# Patient Record
Sex: Female | Born: 1965 | Race: White | Hispanic: No | Smoking: Never smoker
Health system: Southern US, Community
[De-identification: ages and names within clinical notes are randomized; demographics above are authoritative.]

## PROBLEM LIST (undated history)

## (undated) DIAGNOSIS — C801 Malignant (primary) neoplasm, unspecified: Secondary | ICD-10-CM

## (undated) DIAGNOSIS — I10 Essential (primary) hypertension: Secondary | ICD-10-CM

## (undated) DIAGNOSIS — R011 Cardiac murmur, unspecified: Secondary | ICD-10-CM

## (undated) DIAGNOSIS — Z889 Allergy status to unspecified drugs, medicaments and biological substances status: Secondary | ICD-10-CM

## (undated) DIAGNOSIS — F419 Anxiety disorder, unspecified: Secondary | ICD-10-CM

## (undated) DIAGNOSIS — N2889 Other specified disorders of kidney and ureter: Secondary | ICD-10-CM

## (undated) HISTORY — PX: BREAST SURGERY: SHX581

---

## 1987-04-20 HISTORY — PX: TUBAL LIGATION: SHX77

## 2000-10-17 ENCOUNTER — Other Ambulatory Visit: Admission: RE | Admit: 2000-10-17 | Discharge: 2000-10-17 | Payer: Self-pay | Admitting: Obstetrics and Gynecology

## 2000-11-10 ENCOUNTER — Encounter: Payer: Self-pay | Admitting: Obstetrics and Gynecology

## 2000-11-10 ENCOUNTER — Ambulatory Visit (HOSPITAL_COMMUNITY): Admission: RE | Admit: 2000-11-10 | Discharge: 2000-11-10 | Payer: Self-pay | Admitting: Obstetrics and Gynecology

## 2003-04-20 HISTORY — PX: BREAST ENHANCEMENT SURGERY: SHX7

## 2003-08-06 ENCOUNTER — Other Ambulatory Visit: Admission: RE | Admit: 2003-08-06 | Discharge: 2003-08-06 | Payer: Self-pay | Admitting: Obstetrics and Gynecology

## 2004-04-19 HISTORY — PX: OTHER SURGICAL HISTORY: SHX169

## 2011-04-20 DIAGNOSIS — N2889 Other specified disorders of kidney and ureter: Secondary | ICD-10-CM

## 2011-04-20 HISTORY — DX: Other specified disorders of kidney and ureter: N28.89

## 2011-10-06 ENCOUNTER — Encounter (HOSPITAL_COMMUNITY): Payer: Self-pay

## 2011-10-06 ENCOUNTER — Emergency Department (HOSPITAL_COMMUNITY)
Admission: EM | Admit: 2011-10-06 | Discharge: 2011-10-06 | Disposition: A | Discharge status: Home / Self Care | Payer: BC Managed Care – PPO | Attending: Emergency Medicine | Admitting: Emergency Medicine

## 2011-10-06 ENCOUNTER — Emergency Department (HOSPITAL_COMMUNITY): Payer: BC Managed Care – PPO

## 2011-10-06 DIAGNOSIS — C649 Malignant neoplasm of unspecified kidney, except renal pelvis: Secondary | ICD-10-CM

## 2011-10-06 DIAGNOSIS — K769 Liver disease, unspecified: Secondary | ICD-10-CM | POA: Insufficient documentation

## 2011-10-06 DIAGNOSIS — N2889 Other specified disorders of kidney and ureter: Secondary | ICD-10-CM

## 2011-10-06 DIAGNOSIS — I10 Essential (primary) hypertension: Secondary | ICD-10-CM

## 2011-10-06 DIAGNOSIS — E782 Mixed hyperlipidemia: Secondary | ICD-10-CM

## 2011-10-06 DIAGNOSIS — R109 Unspecified abdominal pain: Secondary | ICD-10-CM | POA: Insufficient documentation

## 2011-10-06 DIAGNOSIS — R160 Hepatomegaly, not elsewhere classified: Secondary | ICD-10-CM

## 2011-10-06 DIAGNOSIS — N289 Disorder of kidney and ureter, unspecified: Secondary | ICD-10-CM | POA: Insufficient documentation

## 2011-10-06 DIAGNOSIS — N390 Urinary tract infection, site not specified: Secondary | ICD-10-CM | POA: Insufficient documentation

## 2011-10-06 HISTORY — DX: Essential (primary) hypertension: I10

## 2011-10-06 LAB — DIFFERENTIAL
Eosinophils Absolute: 0.1 10*3/uL (ref 0.0–0.7)
Eosinophils Relative: 1 % (ref 0–5)
Lymphocytes Relative: 13 % (ref 12–46)
Lymphs Abs: 2.3 10*3/uL (ref 0.7–4.0)
Monocytes Absolute: 1.5 10*3/uL — ABNORMAL HIGH (ref 0.1–1.0)
Monocytes Relative: 8 % (ref 3–12)

## 2011-10-06 LAB — COMPREHENSIVE METABOLIC PANEL
AST: 14 U/L (ref 0–37)
BUN: 11 mg/dL (ref 6–23)
CO2: 26 mEq/L (ref 19–32)
Calcium: 9.2 mg/dL (ref 8.4–10.5)
Chloride: 99 mEq/L (ref 96–112)
Creatinine, Ser: 0.71 mg/dL (ref 0.50–1.10)
GFR calc Af Amer: >90 mL/min (ref >90)
GFR calc non Af Amer: >90 mL/min (ref >90)
Glucose, Bld: 85 mg/dL (ref 70–99)
Total Bilirubin: 0.8 mg/dL (ref 0.3–1.2)

## 2011-10-06 LAB — URINE MICROSCOPIC-ADD ON

## 2011-10-06 LAB — CBC
HCT: 41.4 % (ref 36.0–46.0)
Hemoglobin: 14 g/dL (ref 12.0–15.0)
MCH: 27.8 pg (ref 26.0–34.0)
MCV: 82.1 fL (ref 78.0–100.0)
Platelets: 286 10*3/uL (ref 150–400)
RBC: 5.04 MIL/uL (ref 3.87–5.11)
WBC: 17.9 10*3/uL — ABNORMAL HIGH (ref 4.0–10.5)

## 2011-10-06 LAB — LIPASE, BLOOD: Lipase: 40 U/L (ref 11–59)

## 2011-10-06 LAB — URINALYSIS, ROUTINE W REFLEX MICROSCOPIC
Hgb urine dipstick: NEGATIVE
Nitrite: NEGATIVE
Protein, ur: NEGATIVE mg/dL
Specific Gravity, Urine: 1.013 (ref 1.005–1.030)
Urobilinogen, UA: 1 mg/dL (ref 0.0–1.0)

## 2011-10-06 MED ORDER — ONDANSETRON HCL 4 MG PO TABS: 4.0000 mg | ORAL_TABLET | Freq: Four times a day (QID) | ORAL | Status: AC

## 2011-10-06 MED ORDER — OXYCODONE-ACETAMINOPHEN 5-325 MG PO TABS: 1.0000 | ORAL_TABLET | Freq: Four times a day (QID) | ORAL | Status: AC | PRN

## 2011-10-06 MED ORDER — IOHEXOL 300 MG/ML  SOLN
100.0000 mL | Freq: Once | INTRAMUSCULAR | Status: AC | PRN
  Administered 2011-10-06: 80 mL via INTRAVENOUS

## 2011-10-06 MED ORDER — CIPROFLOXACIN HCL 500 MG PO TABS: 500.0000 mg | ORAL_TABLET | Freq: Two times a day (BID) | ORAL | Status: AC

## 2011-10-06 MED ORDER — MORPHINE SULFATE 4 MG/ML IJ SOLN
4.0000 mg | Freq: Once | INTRAMUSCULAR | Status: AC
  Administered 2011-10-06: 4 mg via INTRAVENOUS
  Filled 2011-10-06: qty 1

## 2011-10-06 MED ORDER — ONDANSETRON HCL 4 MG/2ML IJ SOLN
4.0000 mg | Freq: Once | INTRAMUSCULAR | Status: AC
  Administered 2011-10-06: 4 mg via INTRAVENOUS
  Filled 2011-10-06: qty 2

## 2011-10-06 MED ORDER — DEXTROSE 5 % IV SOLN
1.0000 g | Freq: Once | INTRAVENOUS | Status: AC
  Administered 2011-10-06: 1 g via INTRAVENOUS
  Filled 2011-10-06: qty 10

## 2011-10-06 MED ORDER — SODIUM CHLORIDE 0.9 % IV BOLUS (SEPSIS)
1000.0000 mL | Freq: Once | INTRAVENOUS | Status: AC
  Administered 2011-10-06: 1000 mL via INTRAVENOUS

## 2011-10-06 NOTE — Discharge Instructions (Signed)

## 2011-10-06 NOTE — ED Notes (Signed)
Pt reports eating lunch without difficulty. States she saw her MD today and MD suggested coming to ER for CT scan.

## 2011-10-06 NOTE — ED Notes (Signed)
Pt finished drinking oral contrast for CT scan. Denies n/v or pain at this time. Awaiting CT. Resting comfortably. Warm blanket provided.

## 2011-10-06 NOTE — ED Provider Notes (Signed)
History     CSN: [id number]  Arrival date & time 10/06/11  1453   First MD Initiated Contact with Patient 10/06/11 1823      Chief Complaint  Patient presents with  . Abdominal Pain    (Consider location/radiation/quality/duration/timing/severity/associated sxs/prior treatment) Patient is a 46 y.o. female presenting with abdominal pain. The history is provided by the patient. No language interpreter was used.  Abdominal Pain The primary symptoms of the illness include abdominal pain and nausea. The primary symptoms of the illness do not include fever, fatigue, shortness of breath, vomiting, diarrhea, hematemesis, hematochezia, dysuria, vaginal discharge or vaginal bleeding. The current episode started 13 to 24 hours ago. The onset of the illness was gradual. The problem has been gradually worsening.  The abdominal pain began 13 to24 hours ago. The pain came on gradually. The abdominal pain has been gradually worsening since its onset. The abdominal pain is located in the RLQ. The abdominal pain does not radiate. The abdominal pain is relieved by nothing. The abdominal pain is exacerbated by movement and certain positions.  Nausea began today. The nausea is exacerbated by food.  The patient states that she believes she is currently not pregnant. The patient has not had a change in bowel habit. Symptoms associated with the illness do not include chills, constipation, urgency, frequency or back pain.    Past Medical History  Diagnosis Date  . Hypertension     Past Surgical History  Procedure Date  . Cesarean section     x2  . Tummy tuck   . Breast enhancement surgery     History reviewed. No pertinent family history.  History  Substance Use Topics  . Smoking status: Never Smoker   . Smokeless tobacco: Never Used  . Alcohol Use: Yes     ocasionally    OB History    Grav Para Term Preterm Abortions TAB SAB Ect Mult Living                  Review of Systems    Constitutional: Negative for fever, chills, activity change, appetite change and fatigue.  HENT: Negative for congestion, sore throat, rhinorrhea, neck pain and neck stiffness.   Respiratory: Negative for cough and shortness of breath.   Cardiovascular: Negative for chest pain and palpitations.  Gastrointestinal: Positive for nausea and abdominal pain. Negative for vomiting, diarrhea, constipation, blood in stool, hematochezia and hematemesis.  Genitourinary: Negative for dysuria, urgency, frequency, flank pain, vaginal bleeding and vaginal discharge.  Musculoskeletal: Negative for myalgias, back pain and arthralgias.  Neurological: Negative for dizziness, weakness, light-headedness, numbness and headaches.  All other systems reviewed and are negative.    Allergies  Penicillins  Home Medications   Current Outpatient Rx  Name Route Sig Dispense Refill  . CETIRIZINE HCL 10 MG PO TABS Oral Take 10 mg by mouth daily.    Marland Kitchen CITALOPRAM HYDROBROMIDE 10 MG PO TABS Oral Take 10 mg by mouth at bedtime.    Marland Kitchen FLUTICASONE PROPIONATE 50 MCG/ACT NA SUSP Nasal Place 2 sprays into the nose daily.    Marland Kitchen LOSARTAN POTASSIUM-HCTZ 50-12.5 MG PO TABS Oral Take 1 tablet by mouth daily.    Marland Kitchen ONDANSETRON HCL 4 MG PO TABS Oral Take 1 tablet (4 mg total) by mouth every 6 (six) hours. 12 tablet 0  . OXYCODONE-ACETAMINOPHEN 5-325 MG PO TABS Oral Take 1-2 tablets by mouth every 6 (six) hours as needed for pain. 20 tablet 0    BP 103/71  Pulse  58  Temp 99.1 F (37.3 C) (Oral)  Resp 17  SpO2 99%  LMP 09/17/2011  Physical Exam  Nursing note and vitals reviewed. Constitutional: She is oriented to person, place, and time. She appears well-developed and well-nourished. No distress.  HENT:  Head: Normocephalic and atraumatic.  Mouth/Throat: Oropharynx is clear and moist. No oropharyngeal exudate.  Eyes: Conjunctivae and EOM are normal. Pupils are equal, round, and reactive to light.  Neck: Normal range of  motion. Neck supple.  Cardiovascular: Normal rate, regular rhythm, normal heart sounds and intact distal pulses.  Exam reveals no gallop and no friction rub.   No murmur heard. Pulmonary/Chest: Effort normal and breath sounds normal. No respiratory distress. She exhibits no tenderness.  Abdominal: Soft. Bowel sounds are normal. There is tenderness (RLQ abdominal pain at mcburney point.) in the right lower quadrant. There is guarding and tenderness at McBurney's point. There is no rebound and negative Murphy's sign.       +psoas sign  Musculoskeletal: Normal range of motion. She exhibits no edema and no tenderness.  Neurological: She is alert and oriented to person, place, and time. No cranial nerve deficit.  Skin: Skin is warm and dry. No rash noted.    ED Course  Procedures (including critical care time)  Labs Reviewed  CBC - Abnormal; Notable for the following:    WBC 17.9 (*)     All other components within normal limits  DIFFERENTIAL - Abnormal; Notable for the following:    Neutrophils Relative 78 (*)     Neutro Abs 14.1 (*)     Monocytes Absolute 1.5 (*)     All other components within normal limits  URINALYSIS, ROUTINE W REFLEX MICROSCOPIC - Abnormal; Notable for the following:    Leukocytes, UA SMALL (*)     All other components within normal limits  URINE MICROSCOPIC-ADD ON - Abnormal; Notable for the following:    Squamous Epithelial / LPF FEW (*)     Bacteria, UA FEW (*)     All other components within normal limits  LIPASE, BLOOD  COMPREHENSIVE METABOLIC PANEL   Ct Abdomen Pelvis W Contrast  10/06/2011  *RADIOLOGY REPORT*  Clinical Data: Right abdominal pain.  CT ABDOMEN AND PELVIS WITH CONTRAST  Technique:  Multidetector CT imaging of the abdomen and pelvis was performed following the standard protocol during bolus administration of intravenous contrast.  Contrast: 80mL OMNIPAQUE IOHEXOL 300 MG/ML  SOLN  Comparison: None.  Findings: Heterogeneous mixed medium to high  density and low density mass in the midportion of the right kidney, medially, impinging upon and possibly arising from the renal pelvis.  This measures 4.2 x 3.9 cm on delayed postcontrast image number 14 and 3.4 cm in length on coronal image number 46.  Mild prominence of both renal collecting systems, most pronounced in the upper pole on the right.  No renal, ureteral or bladder calculi.  No additional urinary tract masses are seen.  3.4 cm simple appearing left ovarian cyst.  1.8 cm oval area of low density in the left lobe of the liver, superiorly.  This measures 58 HU in density on image number 10.  There is a 6 mm similar appearing area in the spleen on image number 12  Small sliding hiatal hernia.  Bilateral breast implants. Unremarkable pancreas, gallbladder, adrenal glands, left kidney, urinary bladder and right ovary.  The fundus of the uterus is mildly prominent and mildly inhomogeneous.  No gastrointestinal abnormalities or enlarged lymph nodes. Clear lung bases.  Mild lumbar and lower thoracic spine degenerative changes.  IMPRESSION:  1.  4.2 x 3.9 x 3.4 cm right renal mass, as described above. Differential considerations include renal cell carcinoma, transitional cell carcinoma and oncocytoma. 2.  Nonspecific low density masses in the liver and spleen, as described above. These could represent metastases, complex cysts or benign solid masses.  If clinically indicated, these could be better characterized with pre and postcontrast magnetic resonance imaging. 3.  3.4 cm simple appearing left ovarian cyst. 4.  Probable poorly defined fundal uterine fibroid. 5.  Small sliding hiatal hernia.  Original Report Authenticated By: Darrol Angel, M.D.     1. Renal mass, right   2. Liver mass   3. UTI (lower urinary tract infection)       MDM  New renal mass as well as liver masses. Concern that this is a metastatic type process. I discussed the results with the patient. There is no evidence of appy.  She does have a urinary tract infection 4 she is placed on antibiotics. She'll be discharged home with prompt followup with urology. Discussed with both urology and hospitalist service. Should she could be adequately managed at home.        Dayton Bailiff, MD 10/06/11 2250

## 2011-10-06 NOTE — Consult Note (Signed)
History and Physical  FRANCIA VERRY GUY:[id number] DOB: 1965/12/23 DOA: 10/06/2011  Referring physician: PCP: Tomma Lightning, MD   Chief Complaint: Abdominal pain  HPI:  46 yr old CF awoke last pm 01:00 and noted that she had no pain or other issues, but noted pain subsequen tto this.  She tried to ge tin bed and started feeling febrile and sweaty, and felt nauseous and passe dout for about 1/2 an hour and awoke on the hallway in excruciating cramping pain and the pain was localized in the RUQ and she finally went back into bed and awoke this am with some pain and went to work.  SHe got in to hse ehe rPCP Dr. Isabella Stalling and and denotes significant discomfort in the abdomen, but  Was concious of the pain it was maybe 6/10-took Aleve for this.  The pain goes from up the pelvic bone and to the R sid eof her abdomen, no radiaiton to the back NO issue with the BM., no pain passing urine Went to see Doctor may 30th and had her meds doubled for this thought it was her bp meds last pm that might have caused the pain like symptoms  Chart Review:  Never admitted, has had some cosmetic surgeries in the past  Review of Systems:  No dark stool, no vomit, +nauseous, subjective chills, no temp, is perimenopausal and had a period in January/last one May 31st-was a normal cycle at that time.  No h/o abnormal pap smears--last one in June.  No blood in urine, no dysuria, no h/o kidney stones   Past Medical History  Diagnosis Date  . Hypertension     Past Surgical History  Procedure Date  . Cesarean section     x2  . Tummy tuck   . Breast enhancement surgery     Social History:  reports that she has never smoked. She has never used smokeless tobacco. She reports that she drinks alcohol. She reports that she does not use illicit drugs.  Allergies  Allergen Reactions  . Penicillins Hives    History reviewed. No pertinent family history.   Prior to Admission medications   Medication Sig Start Date  End Date Taking? Authorizing Provider  cetirizine (ZYRTEC) 10 MG tablet Take 10 mg by mouth daily.   Yes Historical Provider, MD  citalopram (CELEXA) 10 MG tablet Take 10 mg by mouth at bedtime.   Yes Historical Provider, MD  fluticasone (FLONASE) 50 MCG/ACT nasal spray Place 2 sprays into the nose daily.   Yes Historical Provider, MD  losartan-hydrochlorothiazide (HYZAAR) 50-12.5 MG per tablet Take 1 tablet by mouth daily.   Yes Historical Provider, MD   Physical Exam: Filed Vitals:   10/06/11 1521 10/06/11 1904 10/06/11 1954  BP: 106/45 114/66 103/71  Pulse: 80 72 58  Temp: 99.1 F (37.3 C)    TempSrc: Oral    Resp: 16 16 17   SpO2: 99% 100% 99%     General:  Alert pleasant CF in NAD  Eyes: no pallor/ict  ENT: poor dentition, throat clear  Neck: soft, supple, no lymphadenopathy  Cardiovascular: s1 s2 no m/r/g  Respiratory: clinically clear  Abdomen: soft, diffuse mass in RUQ, below liver,  Painful exam of Kidney in the r flank, but cannot fully assess  Skin: tan, Pfannenstein scar in Lower quadrant  Musculoskeletal: good rom  Psychiatric: euthymmic  Neurologic: CN2-12   Labs on Admission:  Basic Metabolic Panel:  Lab 10/06/11 5638  NA 136  K 3.5  CL  99  CO2 26  GLUCOSE 85  BUN 11  CREATININE 0.71  CALCIUM 9.2  MG --  PHOS --    Liver Function Tests:  Lab 10/06/11 1833  AST 14  ALT 13  ALKPHOS 61  BILITOT 0.8  PROT 7.9  ALBUMIN 4.0    Lab 10/06/11 1833  LIPASE 40  AMYLASE --   No results found for this basename: AMMONIA:5 in the last 168 hours  CBC:  Lab 10/06/11 1833  WBC 17.9*  NEUTROABS 14.1*  HGB 14.0  HCT 41.4  MCV 82.1  PLT 286    Cardiac Enzymes: No results found for this basename: CKTOTAL:5,CKMB:5,CKMBINDEX:5,TROPONINI:5 in the last 168 hours  Troponin (Point of Care Test) No results found for this basename: TROPIPOC in the last 72 hours  BNP (last 3 results) No results found for this basename: PROBNP:3 in the last  8760 hours  CBG: No results found for this basename: GLUCAP:5 in the last 168 hours   Radiological Exams on Admission: Ct Abdomen Pelvis W Contrast  10/06/2011  *RADIOLOGY REPORT*  Clinical Data: Right abdominal pain.  CT ABDOMEN AND PELVIS WITH CONTRAST  Technique:  Multidetector CT imaging of the abdomen and pelvis was performed following the standard protocol during bolus administration of intravenous contrast.  Contrast: 80mL OMNIPAQUE IOHEXOL 300 MG/ML  SOLN  Comparison: None.  Findings: Heterogeneous mixed medium to high density and low density mass in the midportion of the right kidney, medially, impinging upon and possibly arising from the renal pelvis.  This measures 4.2 x 3.9 cm on delayed postcontrast image number 14 and 3.4 cm in length on coronal image number 46.  Mild prominence of both renal collecting systems, most pronounced in the upper pole on the right.  No renal, ureteral or bladder calculi.  No additional urinary tract masses are seen.  3.4 cm simple appearing left ovarian cyst.  1.8 cm oval area of low density in the left lobe of the liver, superiorly.  This measures 58 HU in density on image number 10.  There is a 6 mm similar appearing area in the spleen on image number 12  Small sliding hiatal hernia.  Bilateral breast implants. Unremarkable pancreas, gallbladder, adrenal glands, left kidney, urinary bladder and right ovary.  The fundus of the uterus is mildly prominent and mildly inhomogeneous.  No gastrointestinal abnormalities or enlarged lymph nodes. Clear lung bases.  Mild lumbar and lower thoracic spine degenerative changes.  IMPRESSION:  1.  4.2 x 3.9 x 3.4 cm right renal mass, as described above. Differential considerations include renal cell carcinoma, transitional cell carcinoma and oncocytoma. 2.  Nonspecific low density masses in the liver and spleen, as described above. These could represent metastases, complex cysts or benign solid masses.  If clinically indicated,  these could be better characterized with pre and postcontrast magnetic resonance imaging. 3.  3.4 cm simple appearing left ovarian cyst. 4.  Probable poorly defined fundal uterine fibroid. 5.  Small sliding hiatal hernia.  Original Report Authenticated By: Darrol Angel, M.D.    EKG: Independently reviewed. none   Active Problems:  * No active hospital problems. *     Assessment/Plan 1. Potential renal cell carcinoma versus transitional or urothelial cell carcinoma-I did speak to Dr. Laverle Patter who agrees that she does need close followup in the outpatient setting but states that if her pain is moderately well controlled and can be controlled she could likely be discharged with close followup with [facility] in the outpatient setting.  I did also curbside oncologist Dr.Livesay given her findings and she is agreeable to this plan of care from an oncological standpoint. I discussed this plan of care with ED physician in charge of patient Dr. Brooke Dare who does agree to coordinate her discharge potentially on by mouth Levaquin/ciprofloxacin and agrees to send urine cultures-she does have a potential urinary tract infection although no high-grade fevers and this can be safely treated with 3-5 day course of by mouth antibiotics until seen by urologist--thank you for this consult  Code Status: Full  Family Communication: Discussed with Pt/Family @ bedisde Disposition Plan: Home with close Urology f/u  Pleas Koch, MD  [facility] Pager [phone]  If 8PM-8AM, please contact floor/night-coverage at [email or web address], password [facility] 10/06/2011, 9:52 PM

## 2011-10-06 NOTE — ED Notes (Signed)
Patient returned from CT

## 2011-10-06 NOTE — ED Notes (Signed)
Patient reports that she had excrutiating right abdominal pain that began at 0100. Patient went to Dr. Gayleen Orem this AM and was told to come to the ED for CT scan. Patient states her pain is only 6/10 currently. Patient denies vomiting, fever, blood in urine, diarrhea.

## 2011-10-06 NOTE — ED Notes (Signed)
Pt states she is not sure if she passed out last night from pain or not. Currently denies dizziness and CP. Pt states she was also nauseated last night but did not vomit. Denies N/V currently.

## 2011-10-06 NOTE — ED Notes (Signed)
Patient transported to CT 

## 2011-10-11 ENCOUNTER — Other Ambulatory Visit: Payer: Self-pay | Admitting: Urology

## 2011-10-12 ENCOUNTER — Encounter (HOSPITAL_BASED_OUTPATIENT_CLINIC_OR_DEPARTMENT_OTHER): Payer: Self-pay | Admitting: *Deleted

## 2011-10-12 ENCOUNTER — Other Ambulatory Visit (HOSPITAL_COMMUNITY): Payer: Self-pay | Admitting: Urology

## 2011-10-12 ENCOUNTER — Ambulatory Visit (HOSPITAL_COMMUNITY)
Admission: RE | Admit: 2011-10-12 | Discharge: 2011-10-12 | Disposition: A | Discharge status: Home / Self Care | Payer: BC Managed Care – PPO | Source: Ambulatory Visit | Attending: Urology | Admitting: Urology

## 2011-10-12 DIAGNOSIS — R160 Hepatomegaly, not elsewhere classified: Secondary | ICD-10-CM

## 2011-10-12 DIAGNOSIS — D739 Disease of spleen, unspecified: Secondary | ICD-10-CM | POA: Insufficient documentation

## 2011-10-12 DIAGNOSIS — D1803 Hemangioma of intra-abdominal structures: Secondary | ICD-10-CM | POA: Insufficient documentation

## 2011-10-12 DIAGNOSIS — C649 Malignant neoplasm of unspecified kidney, except renal pelvis: Secondary | ICD-10-CM

## 2011-10-12 DIAGNOSIS — N289 Disorder of kidney and ureter, unspecified: Secondary | ICD-10-CM | POA: Insufficient documentation

## 2011-10-12 MED ORDER — GADOBENATE DIMEGLUMINE 529 MG/ML IV SOLN
12.0000 mL | Freq: Once | INTRAVENOUS | Status: AC | PRN
  Administered 2011-10-12: 12 mL via INTRAVENOUS

## 2011-10-12 NOTE — Progress Notes (Signed)
To [facility] AT 0615,,Ekg,Istat on arrival-Cxr in epic-Npo after Mn.

## 2011-10-14 DIAGNOSIS — N2889 Other specified disorders of kidney and ureter: Secondary | ICD-10-CM

## 2011-10-14 NOTE — H&P (Signed)
Reason For Visit  Natalie Olsen is a 46 year old female with a right renal mass.   History of Present Illness     Right renal mass: A CT scan done on 10/06/11 reveals a 4.2 x 3.9 mass involving the midportion of the right kidney. It is located very near the renal pelvis and appears to be partially cystic with what appears to be enhancement. It appears to arise from the anterior portion of the kidney just above the midline with encroachment on the collecting system. Her urinalysis was noted to be clear.  The patient reports having experienced acute onset right lower quadrant pain but no flank pain. The patient experienced was associated with nausea and diaphoresis but no vomiting or diarrhea. She currently is not having any pain in the flank. She's not seen any gross hematuria. The pain at the time was severe and was not modified by positional change.    Past Medical History Problems  1. History of  Allergies V15.09 2. History of  Hypertension 401.9  Surgical History Problems  1. History of  Abdominoplasty 2. History of  Breast Surgery Enlargement Procedure 3. History of  Cesarean Section  Current Meds 1. CeleXA 10 MG Oral Tablet; Therapy: (Recorded:21Jun2013) to 2. Fluticasone Propionate 50 MCG/ACT Nasal Suspension; Therapy: (Recorded:21Jun2013) to 3. Losartan Potassium-HCTZ 50-12.5 MG Oral Tablet; Therapy: (Recorded:21Jun2013) to 4. ZyrTEC Allergy 10 MG Oral Tablet; Therapy: (Recorded:21Jun2013) to  Allergies Medication  1. Penicillins  Family History Problems  1. Family history of  Acute Myocardial Infarction V17.3 2. Family history of  Diabetes Mellitus V18.0 3. Family history of  Gout V18.19 4. Family history of  Hypertension V17.49  Social History Problems    Alcohol Use   Caffeine Use   Marital History - Single   Never A Smoker  Review of Systems Genitourinary, constitutional, skin, eye, otolaryngeal, hematologic/lymphatic, cardiovascular, pulmonary, endocrine,  musculoskeletal, gastrointestinal, neurological and psychiatric system(s) were reviewed and pertinent findings if present are noted.    Vitals Vital Signs [ BMI Calculated: 22.08 BSA Calculated: 1.54 Height: 5 ft 2 in Weight: 120 lb  Blood Pressure: 154 / 95 Heart Rate: 63  Physical Exam Constitutional: Well nourished and well developed . No acute distress.  ENT:. The ears and nose are normal in appearance.  Neck: The appearance of the neck is normal and no neck mass is present.  Pulmonary: No respiratory distress and normal respiratory rhythm and effort.  Cardiovascular: Heart rate and rhythm are normal . No peripheral edema.  Abdomen: Incision site(s) well healed (She has a lower transverse abdominal incision secondary to her previous abdominoplasty). The abdomen is soft and nontender. No masses are palpated. No CVA tenderness. No hernias are palpable. No hepatosplenomegaly noted.  Lymphatics: The femoral and inguinal nodes are not enlarged or tender.  Skin: Normal skin turgor, no visible rash and no visible skin lesions.  Neuro/Psych:. Mood and affect are appropriate.    Results/Data Urine [Data Includes: Last 1 Day]   21Jun2013  COLOR RED   APPEARANCE CLOUDY   SPECIFIC GRAVITY 1.020   pH 7.0   GLUCOSE NEG mg/dL  BILIRUBIN NEG   KETONE NEG mg/dL  BLOOD LARGE   PROTEIN 30 mg/dL  UROBILINOGEN 4 mg/dL  NITRITE NEG   LEUKOCYTE ESTERASE NEG   SQUAMOUS EPITHELIAL/HPF MANY   WBC 0-3 WBC/hpf  RBC 0-3 RBC/hpf  BACTERIA MANY   CRYSTALS NONE SEEN   CASTS NONE SEEN    The following images/tracing/specimen were independently visualized:  CT scan as above.  The following clinical lab reports were reviewed:  Her creatinine is normal at 0.71.  The following radiology reports were reviewed: CT scan.    Assessment Assessed  1. 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 Rule out  Transitional Cell Carcinoma Of The Kidney Right 189.0         I spoke to the patient  regarding the renal mass. Today I explained the natural history of renal cancer and the way in which the stage,  pathological features, patient health, age, life expectancy, and quality of life all affect the decision-making process, prognosis, and need for additional studies. I also explained how all of these things affect the recommended treatment strategies.  We discussed the accuracy of cancer staging with the current methods of staging.  We reviewed the possibility of the cancer being of a higher or lower stage.  Today I presented all of the standard management options for renal cancer and their associated cancer control outcomes, impact on quality of life, likelihood of achieving goals, risks, complications, and benefits.   We discussed the gold standard for treatment of renal cancer is surgery. I also explained that surveillance would be tailored to the patient based upon the results of the pathology.  We discussed surgery and its different forms including open surgery, laparoscopic surgery, and robotic surgery. I described the risks which included, but are not limited to, bleeding, infection, allergic reaction, heart attack, stroke, pulmonary embolus, death, positioning injury, damage to contiguous structures, need for blood transfusion (and its risk of infection, transfusion reaction, anaphylactic reaction, and death), nerve injury, vascular injury,  urinary leak, fistula, renal failure, and hernia. We also discussed that any laparoscopic or robotic surgery might need to be converted to an open surgery should difficulties be encountered.  We discussed in detail the expectations of surgery with regard to cancer control, as well as expected post-operative recovery process. We also discussed long term outcomes and risks.   We discussed other management options and their risks, benefits.  These include biopsy of the mass, thermal ablation, and embolization.  We discussed that biopsy can be associated with  false negatives, false positives, and risk of spread of the cancer as well as damage to the kidney and bleeding.  I generally do not recommend biopsy of the mass unless we are concerned that the mass is a metastasis from another source or unless the patient is not a good surgical candidate.  We discussed that thermal ablation is generally reserved for patients who are not good surgical candidates or wish to avoid surgery.  We discuss that thermal ablation is inferior to surgery for cancer control.  We discussed that embolization is reserved for patients that are not surgical candidates but are symptomatic from their cancer (such as bleeding or pain).  The patient was encouraged to ask questions throughout the discussion today and all questions were answered to the patient's stated satisfaction. The patient was able to ask pertinent questions showing his understanding of the situation.  At this point the question remains whether this is in fact a transitional cell carcinoma or renal cell carcinoma or other form of renal malignancy. It is quite worrisome for malignant neoplasm and what I have recommended as far as workup goes is to proceed with cystoscopy and right retrograde pyelogram with possible ureteroscopy and biopsy. This will allow the determination of whether she will need a nephrectomy versus nephroureterectomy. I think based on the tumor size and its location that she is  almost certainly going to need a total nephrectomy as opposed to a partial nephrectomy in this case. I will proceed also with further metastatic workup with a chest x-ray. In addition because of the splenic and more importantly liver lesions seen on her CT scan I am going to evaluate the abdomen further with an MRI scan. I will then have her sit down and discuss laparoscopic nephrectomy with Dr. Laverle Patter.   Plan       1. She will be scheduled for cystoscopy with right retrograde pyelogram, possible ureteroscopy and possible stent  placement. 2. I'm going to obtain a chest x-ray. 3. MRI scan of the abdomen 4. I'm going to schedule her for an appointment with Dr. Laverle Patter.

## 2011-10-15 ENCOUNTER — Encounter (HOSPITAL_BASED_OUTPATIENT_CLINIC_OR_DEPARTMENT_OTHER): Payer: Self-pay | Admitting: Anesthesiology

## 2011-10-15 ENCOUNTER — Ambulatory Visit (HOSPITAL_BASED_OUTPATIENT_CLINIC_OR_DEPARTMENT_OTHER)
Admission: RE | Admit: 2011-10-15 | Discharge: 2011-10-15 | Disposition: A | Discharge status: Home / Self Care | Payer: BC Managed Care – PPO | Source: Ambulatory Visit | Attending: Urology | Admitting: Urology

## 2011-10-15 ENCOUNTER — Ambulatory Visit (HOSPITAL_BASED_OUTPATIENT_CLINIC_OR_DEPARTMENT_OTHER): Payer: BC Managed Care – PPO | Admitting: Anesthesiology

## 2011-10-15 ENCOUNTER — Encounter (HOSPITAL_BASED_OUTPATIENT_CLINIC_OR_DEPARTMENT_OTHER): Payer: Self-pay | Admitting: *Deleted

## 2011-10-15 ENCOUNTER — Other Ambulatory Visit: Payer: Self-pay

## 2011-10-15 ENCOUNTER — Encounter (HOSPITAL_BASED_OUTPATIENT_CLINIC_OR_DEPARTMENT_OTHER)
Admission: RE | Disposition: A | Discharge status: Home / Self Care | Payer: Self-pay | Source: Ambulatory Visit | Attending: Urology

## 2011-10-15 DIAGNOSIS — I10 Essential (primary) hypertension: Secondary | ICD-10-CM | POA: Insufficient documentation

## 2011-10-15 DIAGNOSIS — N2889 Other specified disorders of kidney and ureter: Secondary | ICD-10-CM

## 2011-10-15 DIAGNOSIS — N289 Disorder of kidney and ureter, unspecified: Secondary | ICD-10-CM | POA: Insufficient documentation

## 2011-10-15 DIAGNOSIS — Z79899 Other long term (current) drug therapy: Secondary | ICD-10-CM | POA: Insufficient documentation

## 2011-10-15 HISTORY — PX: OTHER SURGICAL HISTORY: SHX169

## 2011-10-15 HISTORY — DX: Other specified disorders of kidney and ureter: N28.89

## 2011-10-15 LAB — POCT I-STAT 4, (NA,K, GLUC, HGB,HCT)
Glucose, Bld: 86 mg/dL (ref 70–99)
HCT: 43 % (ref 36.0–46.0)
Hemoglobin: 14.6 g/dL (ref 12.0–15.0)
Potassium: 3.8 mEq/L (ref 3.5–5.1)
Sodium: 141 mEq/L (ref 135–145)

## 2011-10-15 SURGERY — CYSTOURETEROSCOPY, WITH RETROGRADE PYELOGRAM AND STENT INSERTION
Anesthesia: General | Laterality: Right | Wound class: Clean Contaminated

## 2011-10-15 MED ORDER — IOHEXOL 350 MG/ML SOLN
INTRAVENOUS | Status: DC | PRN
  Administered 2011-10-15: 50 mL

## 2011-10-15 MED ORDER — PHENAZOPYRIDINE HCL 200 MG PO TABS: 200.0000 mg | ORAL_TABLET | Freq: Three times a day (TID) | ORAL | Status: AC | PRN

## 2011-10-15 MED ORDER — KETOROLAC TROMETHAMINE 30 MG/ML IJ SOLN: 15.0000 mg | Freq: Once | INTRAMUSCULAR | Status: DC | PRN

## 2011-10-15 MED ORDER — FENTANYL CITRATE 0.05 MG/ML IJ SOLN
INTRAMUSCULAR | Status: DC | PRN
  Administered 2011-10-15 (×2): 50 ug via INTRAVENOUS

## 2011-10-15 MED ORDER — PROPOFOL 10 MG/ML IV EMUL
INTRAVENOUS | Status: DC | PRN
  Administered 2011-10-15: 180 mg via INTRAVENOUS

## 2011-10-15 MED ORDER — LIDOCAINE HCL (CARDIAC) 20 MG/ML IV SOLN
INTRAVENOUS | Status: DC | PRN
  Administered 2011-10-15: 50 mg via INTRAVENOUS

## 2011-10-15 MED ORDER — KETOROLAC TROMETHAMINE 30 MG/ML IJ SOLN
INTRAMUSCULAR | Status: DC | PRN
  Administered 2011-10-15: 30 mg via INTRAVENOUS

## 2011-10-15 MED ORDER — PROMETHAZINE HCL 25 MG/ML IJ SOLN: 6.2500 mg | INTRAMUSCULAR | Status: DC | PRN

## 2011-10-15 MED ORDER — DEXAMETHASONE SODIUM PHOSPHATE 4 MG/ML IJ SOLN
INTRAMUSCULAR | Status: DC | PRN
  Administered 2011-10-15: 10 mg via INTRAVENOUS

## 2011-10-15 MED ORDER — MIDAZOLAM HCL 5 MG/5ML IJ SOLN
INTRAMUSCULAR | Status: DC | PRN
  Administered 2011-10-15: 2 mg via INTRAVENOUS

## 2011-10-15 MED ORDER — ONDANSETRON HCL 4 MG/2ML IJ SOLN
INTRAMUSCULAR | Status: DC | PRN
  Administered 2011-10-15: 4 mg via INTRAVENOUS

## 2011-10-15 MED ORDER — LACTATED RINGERS IV SOLN
INTRAVENOUS | Status: DC
  Administered 2011-10-15: 100 mL/h via INTRAVENOUS

## 2011-10-15 MED ORDER — CIPROFLOXACIN IN D5W 200 MG/100ML IV SOLN
200.0000 mg | INTRAVENOUS | Status: AC
  Administered 2011-10-15: 200 mg via INTRAVENOUS

## 2011-10-15 MED ORDER — FENTANYL CITRATE 0.05 MG/ML IJ SOLN: 25.0000 ug | INTRAMUSCULAR | Status: DC | PRN

## 2011-10-15 MED ORDER — PHENAZOPYRIDINE HCL 200 MG PO TABS
200.0000 mg | ORAL_TABLET | Freq: Once | ORAL | Status: AC
  Administered 2011-10-15: 200 mg via ORAL

## 2011-10-15 MED ORDER — SODIUM CHLORIDE 0.9 % IR SOLN
Status: DC | PRN
  Administered 2011-10-15: 3000 mL

## 2011-10-15 MED ORDER — EPHEDRINE SULFATE 50 MG/ML IJ SOLN
INTRAMUSCULAR | Status: DC | PRN
  Administered 2011-10-15: 5 mg via INTRAVENOUS

## 2011-10-15 SURGICAL SUPPLY — 38 items
ADAPTER CATH URET PLST 4-6FR (CATHETERS) IMPLANT
ADPR CATH URET STRL DISP 4-6FR (CATHETERS)
BAG DRAIN URO-CYSTO SKYTR STRL (DRAIN) ×2 IMPLANT
BAG DRN UROCATH (DRAIN) ×1
BASKET LASER NITINOL 1.9FR (BASKET) IMPLANT
BASKET STNLS GEMINI 4WIRE 3FR (BASKET) IMPLANT
BASKET ZERO TIP NITINOL 2.4FR (BASKET) IMPLANT
BRUSH URET BIOPSY 3F (UROLOGICAL SUPPLIES) IMPLANT
BSKT STON RTRVL 120 1.9FR (BASKET)
BSKT STON RTRVL GEM 120X11 3FR (BASKET)
BSKT STON RTRVL ZERO TP 2.4FR (BASKET)
CANISTER SUCT LVC 12 LTR MEDI- (MISCELLANEOUS) ×2 IMPLANT
CATH CLEAR GEL 3F BACKSTOP (CATHETERS) IMPLANT
CATH INTERMIT  6FR 70CM (CATHETERS) ×1 IMPLANT
CATH URET 5FR 28IN CONE TIP (BALLOONS)
CATH URET 5FR 70CM CONE TIP (BALLOONS) IMPLANT
CLOTH BEACON ORANGE TIMEOUT ST (SAFETY) ×2 IMPLANT
DRAPE CAMERA CLOSED 9X96 (DRAPES) ×2 IMPLANT
ELECT REM PT RETURN 9FT ADLT (ELECTROSURGICAL) ×2
ELECTRODE REM PT RTRN 9FT ADLT (ELECTROSURGICAL) ×1 IMPLANT
GLOVE BIO SURGEON STRL SZ8 (GLOVE) ×2 IMPLANT
GLOVE INDICATOR 6.5 STRL GRN (GLOVE) ×2 IMPLANT
GOWN PREVENTION PLUS LG XLONG (DISPOSABLE) ×3 IMPLANT
GOWN STRL REIN XL XLG (GOWN DISPOSABLE) ×2 IMPLANT
GUIDEWIRE 0.038 PTFE COATED (WIRE) IMPLANT
GUIDEWIRE ANG ZIPWIRE 038X150 (WIRE) IMPLANT
GUIDEWIRE STR DUAL SENSOR (WIRE) ×2 IMPLANT
IV NS IRRIG 3000ML ARTHROMATIC (IV SOLUTION) ×4 IMPLANT
KIT BALLIN UROMAX 15FX10 (LABEL) IMPLANT
KIT BALLN UROMAX 15FX4 (MISCELLANEOUS) IMPLANT
KIT BALLN UROMAX 26 75X4 (MISCELLANEOUS)
LASER FIBER DISP (UROLOGICAL SUPPLIES) IMPLANT
PACK CYSTOSCOPY (CUSTOM PROCEDURE TRAY) ×2 IMPLANT
SET HIGH PRES BAL DIL (LABEL)
SHEATH ACCESS URETERAL 38CM (SHEATH) IMPLANT
SHEATH ACCESS URETERAL 54CM (SHEATH) IMPLANT
SHEATH URET ACCESS 12FR/35CM (UROLOGICAL SUPPLIES) ×1 IMPLANT
WATER STERILE IRR 3000ML UROMA (IV SOLUTION) IMPLANT

## 2011-10-15 NOTE — Op Note (Signed)
PATIENT:  Natalie Olsen  PRE-OPERATIVE DIAGNOSIS:  Right renal mass  POST-OPERATIVE DIAGNOSIS:  Right renal mass not transitional cell carcinoma.  PROCEDURE:  Procedure(s): 1. Cystoscopy with right retrograde pyelogram including interpretation.  2. Right ureteroscopy   SURGEON:  Garnett Farm  INDICATION: Mrs. Gafford is a 46 year old female who was found to have a right renal mass located in the area of the hilum near the lower pole. Although her urine was clear the location of the tumor raises the possibility that it could be transitional cell carcinoma although in all likelihood it was felt to more likely represent a renal parenchymal tumor. The need for definitive determination whether it was arising from the renal pelvis and could potentially be transitional cell carcinoma was discussed and the patient elected to proceed with a retrograde pyelogram with ureteroscopy and possible biopsy.  ANESTHESIA:  General  EBL:  Minimal  DRAINS: None  SPECIMEN:  None  Description of procedure: After informed consent the patient was taken to the major or, placed on the table and administered general anesthesia. She was then moved to the dorsal lithotomy position and her genitalia sterilely prepped and draped. An official timeout was then performed.  The 22 French cystoscope was then passed under direct vision into the bladder and the bladder was fully and systematically inspected. No tumors, stones or lesions were noted within the bladder. The ureteral orifices were of normal configuration and position with clear reflux.  A right retrograde pyelogram was performed by passing a 6 [place] open-ended ureteral catheter through the cystoscope and into the right ureteral orifice. I then injected full-strength contrast under direct fluoroscopic visualization and noted the ureter was normal throughout its course with no filling defect or mass effect. The intrarenal collecting system filled but there appeared  to be some possible blunting of the lower pole calyx. There was no evidence of definite mass within the renal pelvis.  A 0.038 inch floppy-tipped guidewire was then passed up the right ureter into the area the renal pelvis and the inner portion of a ureteral access sheath was then passed over the guidewire to gently dilate the intramural ureter. I then removed this and passed the 6 French flexible, digital ureteroscope over the guidewire into the area of the renal pelvis and then remove the guidewire. Nephroscopy was performed with the digital ureteroscope and each calyx was systematically inspected starting at the superior aspect of the kidney and progressing to the lower pole. Specifically the lower pole calyx that appeared to be most likely to have been the origin of a transitional cell tumor was fully inspected and noted to be entirely normal. The intrarenal collecting system including the renal pelvis was again systematically inspected and there was absolutely no abnormality whatsoever of the urothelium. I therefore backed the ureteroscope down the ureter under direct vision and noted the ureter to be normal throughout its course. The ureteroscope was removed and the cystoscope sheath reinserted to drain the bladder. The patient was then awakened and taken to the recovery room in stable and satisfactory condition. She tolerated the procedure well and there were no intraoperative complications.  PLAN OF CARE: Discharge to home after PACU  PATIENT DISPOSITION:  PACU - hemodynamically stable.

## 2011-10-15 NOTE — Anesthesia Postprocedure Evaluation (Signed)
  Anesthesia Post-op Note  Patient: Natalie Olsen  Procedure(s) Performed: Procedure(s) (LRB): CYSTOSCOPY WITH RETROGRADE PYELOGRAM, URETEROSCOPY AND STENT PLACEMENT (Right)  Patient Location: PACU  Anesthesia Type: General  Level of Consciousness: awake and alert   Airway and Oxygen Therapy: Patient Spontanous Breathing  Post-op Pain: mild  Post-op Assessment: Post-op Vital signs reviewed, Patient's Cardiovascular Status Stable, Respiratory Function Stable, Patent Airway and No signs of Nausea or vomiting  Post-op Vital Signs: stable  Complications: No apparent anesthesia complications

## 2011-10-15 NOTE — Transfer of Care (Signed)
Immediate Anesthesia Transfer of Care Note  Patient: Natalie Olsen  Procedure(s) Performed: Procedure(s) (LRB): CYSTOSCOPY WITH RETROGRADE PYELOGRAM, URETEROSCOPY AND STENT PLACEMENT (Right)  Patient Location: PACU  Anesthesia Type: General  Level of Consciousness: awake and oriented  Airway & Oxygen Therapy: Patient Spontanous Breathing and Patient connected to nasal cannula oxygen  Post-op Assessment: Report given to PACU RN  Post vital signs: Reviewed and stable  Complications: No apparent anesthesia complications

## 2011-10-15 NOTE — Discharge Instructions (Signed)

## 2011-10-15 NOTE — Interval H&P Note (Signed)
History and Physical Interval Note:  10/15/2011 7:16 AM  Natalie Olsen  has presented today for surgery, with the diagnosis of right renal mass  The various methods of treatment have been discussed with the patient and family. After consideration of risks, benefits and other options for treatment, the patient has consented to  Procedure(s) (LRB): CYSTOSCOPY WITH RETROGRADE PYELOGRAM, URETEROSCOPY AND STENT PLACEMENT (N/A) as a surgical intervention .  The patient's history has been reviewed, patient examined, no change in status, stable for surgery.  I have reviewed the patients' chart and labs.  Questions were answered to the patient's satisfaction.     Garnett Farm

## 2011-10-15 NOTE — Anesthesia Procedure Notes (Signed)
Procedure Name: LMA Insertion Date/Time: 10/15/2011 7:25 AM Performed by: Maris Berger T Pre-anesthesia Checklist: Patient identified, Emergency Drugs available, Suction available and Patient being monitored Patient Re-evaluated:Patient Re-evaluated prior to inductionOxygen Delivery Method: Circle System Utilized Preoxygenation: Pre-oxygenation with 100% oxygen Intubation Type: IV induction Ventilation: Mask ventilation without difficulty LMA: LMA inserted LMA Size: 4.0 Number of attempts: 1 Airway Equipment and Method: bite block Placement Confirmation: positive ETCO2 Dental Injury: Teeth and Oropharynx as per pre-operative assessment

## 2011-10-15 NOTE — Anesthesia Preprocedure Evaluation (Addendum)
Anesthesia Evaluation  Patient identified by MRN, date of birth, ID band Patient awake    Reviewed: Allergy & Precautions, H&P , NPO status , Patient's Chart, lab work & pertinent test results  Airway Mallampati: II TM Distance: <3 FB Neck ROM: Full    Dental No notable dental hx.    Pulmonary neg pulmonary ROS,  breath sounds clear to auscultation  Pulmonary exam normal       Cardiovascular hypertension, Pt. on medications Rhythm:Regular Rate:Normal     Neuro/Psych negative neurological ROS  negative psych ROS   GI/Hepatic negative GI ROS, Neg liver ROS,   Endo/Other  negative endocrine ROS  Renal/GU negative Renal ROS  negative genitourinary   Musculoskeletal negative musculoskeletal ROS (+)   Abdominal   Peds negative pediatric ROS (+)  Hematology negative hematology ROS (+)   Anesthesia Other Findings   Reproductive/Obstetrics negative OB ROS                           Anesthesia Physical Anesthesia Plan  ASA: II  Anesthesia Plan: General   Post-op Pain Management:    Induction: Intravenous  Airway Management Planned: LMA  Additional Equipment:   Intra-op Plan:   Post-operative Plan:   Informed Consent: I have reviewed the patients History and Physical, chart, labs and discussed the procedure including the risks, benefits and alternatives for the proposed anesthesia with the patient or authorized representative who has indicated his/her understanding and acceptance.   Dental advisory given  Plan Discussed with: CRNA  Anesthesia Plan Comments:         Anesthesia Quick Evaluation

## 2011-10-18 DIAGNOSIS — C801 Malignant (primary) neoplasm, unspecified: Secondary | ICD-10-CM

## 2011-10-18 HISTORY — DX: Malignant (primary) neoplasm, unspecified: C80.1

## 2011-10-22 ENCOUNTER — Encounter (HOSPITAL_BASED_OUTPATIENT_CLINIC_OR_DEPARTMENT_OTHER): Payer: Self-pay

## 2011-11-02 ENCOUNTER — Encounter (HOSPITAL_COMMUNITY): Payer: Self-pay | Admitting: Pharmacy Technician

## 2011-11-02 ENCOUNTER — Other Ambulatory Visit: Payer: Self-pay | Admitting: Urology

## 2011-11-05 ENCOUNTER — Encounter (HOSPITAL_COMMUNITY)
Admission: RE | Admit: 2011-11-05 | Discharge: 2011-11-05 | Disposition: A | Discharge status: Home / Self Care | Payer: BC Managed Care – PPO | Source: Ambulatory Visit | Attending: Urology | Admitting: Urology

## 2011-11-05 ENCOUNTER — Encounter (HOSPITAL_COMMUNITY): Payer: Self-pay

## 2011-11-05 HISTORY — DX: Anxiety disorder, unspecified: F41.9

## 2011-11-05 HISTORY — DX: Cardiac murmur, unspecified: R01.1

## 2011-11-05 LAB — BASIC METABOLIC PANEL
CO2: 28 mEq/L (ref 19–32)
Calcium: 10 mg/dL (ref 8.4–10.5)
Chloride: 101 mEq/L (ref 96–112)
GFR calc Af Amer: >90 mL/min (ref >90)
Sodium: 137 mEq/L (ref 135–145)

## 2011-11-05 LAB — CBC
HCT: 41.1 % (ref 36.0–46.0)
MCV: 81.4 fL (ref 78.0–100.0)
Platelets: 228 10*3/uL (ref 150–400)
RBC: 5.05 MIL/uL (ref 3.87–5.11)
WBC: 7.4 10*3/uL (ref 4.0–10.5)

## 2011-11-05 LAB — SURGICAL PCR SCREEN: Staphylococcus aureus: NEGATIVE

## 2011-11-05 NOTE — Patient Instructions (Signed)
YOUR SURGERY IS SCHEDULED ON:  Monday  7/22  AT 7:15 AM  REPORT TO [facility] AT:  5:15 AM      PHONE # FOR SHORT STAY IS [phone]  DO NOT EAT OR DRINK ANYTHING AFTER MIDNIGHT THE NIGHT BEFORE YOUR SURGERY.  YOU MAY BRUSH YOUR TEETH, RINSE OUT YOUR MOUTH--BUT NO WATER, NO FOOD, NO CHEWING GUM, NO MINTS, NO CANDIES, NO CHEWING TOBACCO.  PLEASE TAKE THE FOLLOWING MEDICATIONS THE AM OF YOUR SURGERY WITH A FEW SIPS OF WATER:  NO MEDICINE TO TAKE-BUT MAY USE YOUR FLUTICASONE NASAL SPRAY    IF YOU USE INHALERS--USE YOUR INHALERS THE AM OF YOUR SURGERY AND BRING INHALERS TO THE HOSPITAL -TAKE TO SURGERY.    IF YOU ARE DIABETIC:  DO NOT TAKE ANY DIABETIC MEDICATIONS THE AM OF YOUR SURGERY.  IF YOU TAKE INSULIN IN THE EVENINGS--PLEASE ONLY TAKE 1/2 NORMAL EVENING DOSE THE NIGHT BEFORE YOUR SURGERY.  NO INSULIN THE AM OF YOUR SURGERY.  IF YOU HAVE SLEEP APNEA AND USE CPAP OR BIPAP--PLEASE BRING THE MASK --NOT THE MACHINE-NOT THE TUBING   -JUST THE MASK. DO NOT BRING VALUABLES, MONEY, CREDIT CARDS.  CONTACT LENS, DENTURES / PARTIALS, GLASSES SHOULD NOT BE WORN TO SURGERY AND IN MOST CASES-HEARING AIDS WILL NEED TO BE REMOVED.  BRING YOUR GLASSES CASE, ANY EQUIPMENT NEEDED FOR YOUR CONTACT LENS. FOR PATIENTS ADMITTED TO THE HOSPITAL--CHECK OUT TIME THE DAY OF DISCHARGE IS 11:00 AM.  ALL INPATIENT ROOMS ARE PRIVATE - WITH BATHROOM, TELEPHONE, TELEVISION AND WIFI INTERNET. IF YOU ARE BEING DISCHARGED THE SAME DAY OF YOUR SURGERY--YOU CAN NOT DRIVE YOURSELF HOME--AND SHOULD NOT GO HOME ALONE BY TAXI OR BUS.  NO DRIVING OR OPERATING MACHINERY FOR 24 HOURS FOLLOWING ANESTHESIA / PAIN MEDICATIONS.                            SPECIAL INSTRUCTIONS:  CHLORHEXIDINE SOAP SHOWER (other brand names are Betasept and Hibiclens ) PLEASE SHOWER WITH CHLORHEXIDINE THE NIGHT BEFORE YOUR SURGERY AND THE AM OF YOUR SURGERY. DO NOT USE CHLORHEXIDINE ON YOUR FACE OR PRIVATE AREAS--YOU MAY USE YOUR NORMAL  SOAP THOSE AREAS AND YOUR NORMAL SHAMPOO.  WOMEN SHOULD AVOID SHAVING UNDER ARMS AND SHAVING LEGS 48 HOURS BEFORE USING CHLORHEXIDINE TO AVOID SKIN IRRITATION.  DO NOT USE IF ALLERGIC TO CHLORHEXIDINE.  PLEASE READ OVER ANY  FACT SHEETS THAT YOU WERE GIVEN: MRSA INFORMATION, BLOOD TRANSFUSION INFORMATION, INCENTIVE SPIROMETER INFORMATION.

## 2011-11-05 NOTE — H&P (Signed)
Chief Complaint  Right renal neoplasm   Reason For Visit  Reason for consult: Right renal neoplasm.  Evaluate for minimally invasive surgical options. Physician requesting consult: Dr. Ihor Gully PCP: Dr. Tomma Lightning   History of Present Illness  Natalie Olsen is a 46 year old female who presented to the ED on 10/06/11 with severe right lower quadrant pain associated with nausea and diaphoresis.  A CT scan of the abdomen with contrast was performed which demonstrated no obvious cause for her right lower quadrant pain but did demonstrate a 4.2 cm centrally located, heterogeneous mass in the right kidney which appeared to be enhancing.  She was also incidentally noted to have a benign appearing 3.4 cm left ovarian cyst as well as lesions in the liver and spleen. Her pain subsequently improved spontaneously.  She has denied hematuria and has no family history of kidney cancer. She was seen by Dr. Vernie Ammons who ordered an MRI which was performed on 6/25 and confirmed a 3.9 cm heterogeneous and clearly enhancing mass near the renal hilum of the right kidney. There are areas of central hypointensity likely indicating central necrosis. There is no regional lymphadenopathy, contralateral renal abnormalities, renal vein/IVC involvement, adrenal abnormalities, or other evidence of abdominal metastases. Her liver lesion was consistent with a 2 cm hemangioma and her spleen lesion appeared to be 1 cm splenic cyst which each lesion appearing benign and not likely to be metastatic disease. A chest x-ray from 6/25 was also performed and was negative for metastases to the chest. Due to the central location of this mass, Dr. Vernie Ammons proceeded with right retrograde pyelography and ureteroscopy which was unremarkable for evidence of urothelial carcinoma indicating that her renal lesion was most worrisome for renal cell carcinoma.  While a multilocular cystic nephroma would also be in the differential diagnosis, this lesion is  more concerning for renal malignancy based on review of her imaging studies today.  She has no risk factors for renal disease and her baseline renal function is normal.  Baseline Cr is 0.71.  She also has undergone a prior abdominoplasty approximately 7 years ago. 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 She is interested in potentially having this addressed at the time of her renal surgery.   Past Medical History Problems  1. History of  Allergies V15.09 2. History of  Hypertension 401.9  Surgical History Problems  1. History of  Abdominoplasty 2. History of  Breast Surgery Enlargement Procedure 3. History of  Cesarean Section  Current Meds 1. CeleXA 10 MG Oral Tablet; Therapy: (Recorded:21Jun2013) to 2. Fluticasone Propionate 50 MCG/ACT Nasal Suspension; Therapy: (Recorded:21Jun2013) to 3. Losartan Potassium-HCTZ 50-12.5 MG Oral Tablet; Therapy: (Recorded:21Jun2013) to 4. ZyrTEC Allergy 10 MG Oral Tablet; Therapy: (Recorded:21Jun2013) to  Allergies Medication  1. Penicillins  Family History Problems  1. Family history of  Acute Myocardial Infarction V17.3 2. Family history of  Diabetes Mellitus V18.0 3. Family history of  Gout V18.19 4. Family history of  Hypertension V17.49  Social History Problems    Alcohol Use   Caffeine Use   Marital History - Single   Never A Smoker  Review of Systems Constitutional, skin, eye, otolaryngeal, hematologic/lymphatic, cardiovascular, pulmonary, endocrine, musculoskeletal, gastrointestinal, neurological and psychiatric system(s) were reviewed and pertinent findings if present are noted.  Genitourinary: no hematuria.  Gastrointestinal: no nausea and no vomiting.  Constitutional: no fever, no night sweats and no recent  weight loss.    Vitals Vital Signs [Data Includes: Last 1 Day]  16Jul2013 07:45AM  Blood Pressure: 144 / 85 Heart Rate:  70  Physical Exam Constitutional: Well nourished and well developed . No acute distress.  ENT:. The ears and nose are normal in appearance.  Neck: The appearance of the neck is normal and no neck mass is present.  Pulmonary: No respiratory distress, normal respiratory rhythm and effort and clear bilateral breath sounds.  Cardiovascular: Heart rate and rhythm are normal . No peripheral edema.  Abdomen: The abdomen is soft and nontender. No masses are palpated. No CVA tenderness. No hernias are palpable. No hepatosplenomegaly noted. She has a well-healed transverse lower abdominal incision from her prior abdominoplasty.  Lymphatics: The supraclavicular, femoral and inguinal nodes are not enlarged or tender.  Skin: Normal skin turgor, no visible rash and no visible skin lesions.  Neuro/Psych:. Mood and affect are appropriate.    Results/Data Urine [Data Includes: Last 1 Day]   16Jul2013  COLOR YELLOW   APPEARANCE CLEAR   SPECIFIC GRAVITY 1.025   pH 5.5   GLUCOSE NEG mg/dL  BILIRUBIN NEG   KETONE NEG mg/dL  BLOOD TRACE   PROTEIN NEG mg/dL  UROBILINOGEN 0.2 mg/dL  NITRITE NEG   LEUKOCYTE ESTERASE NEG   SQUAMOUS EPITHELIAL/HPF FEW   WBC 0-2 WBC/hpf  RBC 0-2 RBC/hpf  BACTERIA RARE   CRYSTALS NONE SEEN   CASTS NONE SEEN        I have independently reviewed her CT scan from 6/19, her more recent MRI on 6/25, and her chest x-ray from 625. I've also reviewed her medical records including her notes, operative reports, and retrograde pyelogram studies. Findings are as dictated above.   Assessment Assessed  1. Renal Neoplasm 239.5  Plan Health Maintenance (V70.0)  1. UA With REFLEX  Done: 16Jul2013 07:54AM  Discussion/Summary  1. Right renal neoplasm concerning for malignancy:   The patient was provided information regarding their renal mass including the relative risk of benign versus malignant pathology and the natural history of renal cell carcinoma and other possible  malignancies of the kidney. The role of renal biopsy, laboratory testing, and imaging studies to further characterize renal masses and/or the presence of metastatic disease were explained. We discussed the role of active surveillance, surgical therapy with both radical nephrectomy and nephron-sparing surgery, and ablative therapy in the treatment of renal masses. In addition, we discussed our goals of providing an accurate diagnosis and oncologic control while maintaining optimal renal function as appropriate based on the size, location, and complexity of their renal mass as well as their co-morbidities.    We have discussed the risks of treatment in detail including but not limited to bleeding, infection, heart attack, stroke, death, venothromoboembolism, cancer recurrence, injury/damage to surrounding organs and structures, urine leak, the possibility of open surgical conversion for patients undergoing minimally invasive surgery, the risk of developing chronic kidney disease and its associated implications, and the potential risk of end stage renal disease possibly necessitating dialysis.   I recommended proceeding with a right laparoscopic radical nephrectomy. She is agreeable to proceeding gives her informed consent today. We also discussed her apparent suture sinus from her prior surgery. I did make it clear that since I was not the surgeon who performed her prior procedure that I cannot say that this is definitely a suture sinus based on her evaluation today. However, I did tell her that it was reasonable to consider a small elliptical incision to further  evaluate this in the subcutaneous tissues and that I would remove any suture that was preventing her wound from healing. She does understand the risk of scarring in this area.  A total of 70 minutes were spent in the overall care of the patient today with 55 minutes in direct face to face consultation.   Cc: Dr. Ihor Gully Dr. Tomma Lightning      Verified Results COMPREHENSIVE METABOLIC PANEL1 16Jul2013 [id number] Natalie Olsen  SPECIMEN TYPE: BLOOD  [Nov 02, 2011 6:20PM Natalie Olsen] Notify patient that labs were normal.   Test Name Result Flag Reference  GLUCOSE1 77 mg/dL1  70-991  BUN1 16 mg/dL1  6-231  CREATININE1 0.82 mg/dL1  0.50-1.401  SODIUM1 139 mEq/L1  256 481 [id number]  POTASSIUM1 3.7 mEq/L1  3.5-5.31  CHLORIDE1 101 mEq/L1  96-1121  CO21 28 mEq/L1  19-321  CALCIUM1 9.6 mg/dL1  8.4-10.51  TOTAL PROTEIN1 7.5 g/dL1  6.0-8.31  ALBUMIN1 4.5 g/dL1  3.5-5.21  AST/SGOT1 19 U/L1  0-371  ALT/SGPT1 16 U/L1  0-351  ALKALINE PHOSPHATASE1 58 U/L1  39-1171  BILIRUBIN, TOTAL1 0.9 mg/dL1  0.3-1.21  Est GFR, African American1 >89 mL/min1    Est GFR, NonAfrican American1 85 mL/min1    The estimated GFR is a calculation valid for adults (45 to 48 years old) that uses the CKD-EPI algorithm to adjust for age and sex. It is not to be used for children, pregnant women, hospitalized patients, patients on dialysis, or with rapidly changing kidney function. According to the NKDEP, eGFR >89 is normal, 60-89 shows mild impairment, 30-59 shows moderate impairment, 15-29 shows severe impairment and <15 is ESRD.     1. Amended By: Heloise Purpura; 11/02/2011 6:20 PMEST  Signatures Electronically signed by : Heloise Purpura, M.D.; Nov 02 2011  6:20PM

## 2011-11-05 NOTE — Pre-Procedure Instructions (Signed)
CBC, BMET AND EKG WERE DONE TODAY AT [facility] PREOP.  T/S TO BE DRAWN ON ARRIVAL FOR SURGERY.  CXR REPORT FROM 10/12/11 [facility] IS IN EPIC AND COPY ON PT'S CHART. PT STATES SHE HAS BOWEL PREP INSTRUCTIONS TO FOLLOW DAY BEFORE HER SURGERY-GIVEN TO HER BY DR. Vevelyn Royals OFFICE. PREOP TEACHING WAS DONE USING THE TEACH BACK METHOD.

## 2011-11-08 ENCOUNTER — Ambulatory Visit (HOSPITAL_COMMUNITY): Payer: BC Managed Care – PPO | Admitting: Anesthesiology

## 2011-11-08 ENCOUNTER — Encounter (HOSPITAL_COMMUNITY)
Admission: RE | Disposition: A | Discharge status: Home / Self Care | Payer: Self-pay | Source: Ambulatory Visit | Attending: Urology

## 2011-11-08 ENCOUNTER — Encounter (HOSPITAL_COMMUNITY): Payer: Self-pay | Admitting: Anesthesiology

## 2011-11-08 ENCOUNTER — Inpatient Hospital Stay (HOSPITAL_COMMUNITY)
Admission: RE | Admit: 2011-11-08 | Discharge: 2011-11-11 | DRG: 303 | Disposition: A | Discharge status: Home / Self Care | Payer: BC Managed Care – PPO | Source: Ambulatory Visit | Attending: Urology | Admitting: Urology

## 2011-11-08 ENCOUNTER — Encounter (HOSPITAL_COMMUNITY): Payer: Self-pay

## 2011-11-08 DIAGNOSIS — M795 Residual foreign body in soft tissue: Secondary | ICD-10-CM | POA: Diagnosis present

## 2011-11-08 DIAGNOSIS — Z9889 Other specified postprocedural states: Secondary | ICD-10-CM

## 2011-11-08 DIAGNOSIS — N83209 Unspecified ovarian cyst, unspecified side: Secondary | ICD-10-CM | POA: Diagnosis present

## 2011-11-08 DIAGNOSIS — C649 Malignant neoplasm of unspecified kidney, except renal pelvis: Principal | ICD-10-CM | POA: Diagnosis present

## 2011-11-08 DIAGNOSIS — Y838 Other surgical procedures as the cause of abnormal reaction of the patient, or of later complication, without mention of misadventure at the time of the procedure: Secondary | ICD-10-CM | POA: Diagnosis present

## 2011-11-08 DIAGNOSIS — D7389 Other diseases of spleen: Secondary | ICD-10-CM | POA: Diagnosis present

## 2011-11-08 DIAGNOSIS — L98499 Non-pressure chronic ulcer of skin of other sites with unspecified severity: Secondary | ICD-10-CM | POA: Diagnosis present

## 2011-11-08 DIAGNOSIS — I10 Essential (primary) hypertension: Secondary | ICD-10-CM | POA: Diagnosis present

## 2011-11-08 DIAGNOSIS — D1803 Hemangioma of intra-abdominal structures: Secondary | ICD-10-CM | POA: Diagnosis present

## 2011-11-08 DIAGNOSIS — Z01812 Encounter for preprocedural laboratory examination: Secondary | ICD-10-CM

## 2011-11-08 DIAGNOSIS — Z1889 Other specified retained foreign body fragments: Secondary | ICD-10-CM

## 2011-11-08 DIAGNOSIS — Z880 Allergy status to penicillin: Secondary | ICD-10-CM

## 2011-11-08 HISTORY — PX: LAPAROSCOPIC NEPHRECTOMY: SHX1930

## 2011-11-08 LAB — BASIC METABOLIC PANEL
BUN: 15 mg/dL (ref 6–23)
Creatinine, Ser: 0.95 mg/dL (ref 0.50–1.10)
GFR calc Af Amer: 81 mL/min — ABNORMAL LOW (ref >90)
GFR calc non Af Amer: 70 mL/min — ABNORMAL LOW (ref >90)

## 2011-11-08 LAB — TYPE AND SCREEN
ABO/RH(D): B POS
Antibody Screen: NEGATIVE

## 2011-11-08 SURGERY — NEPHRECTOMY, RADICAL, LAPAROSCOPIC, ADULT
Anesthesia: General | Laterality: Right | Wound class: Clean

## 2011-11-08 MED ORDER — BUPIVACAINE LIPOSOME 1.3 % IJ SUSP
20.0000 mL | Freq: Once | INTRAMUSCULAR | Status: AC
  Administered 2011-11-08: 40 mL
  Filled 2011-11-08: qty 20

## 2011-11-08 MED ORDER — LACTATED RINGERS IV SOLN
INTRAVENOUS | Status: DC
  Administered 2011-11-08: 11:00:00 via INTRAVENOUS

## 2011-11-08 MED ORDER — GLYCOPYRROLATE 0.2 MG/ML IJ SOLN
INTRAMUSCULAR | Status: DC | PRN
  Administered 2011-11-08: 0.6 mg via INTRAVENOUS

## 2011-11-08 MED ORDER — DOCUSATE SODIUM 100 MG PO CAPS
100.0000 mg | ORAL_CAPSULE | Freq: Two times a day (BID) | ORAL | Status: DC
  Administered 2011-11-08 (×2): 100 mg via ORAL
  Filled 2011-11-08 (×4): qty 1

## 2011-11-08 MED ORDER — CITALOPRAM HYDROBROMIDE 10 MG PO TABS
10.0000 mg | ORAL_TABLET | Freq: Every day | ORAL | Status: DC
  Administered 2011-11-08 – 2011-11-10 (×3): 10 mg via ORAL
  Filled 2011-11-08 (×4): qty 1

## 2011-11-08 MED ORDER — ACETAMINOPHEN 10 MG/ML IV SOLN
INTRAVENOUS | Status: AC
  Filled 2011-11-08: qty 100

## 2011-11-08 MED ORDER — SUFENTANIL CITRATE 50 MCG/ML IV SOLN
INTRAVENOUS | Status: DC | PRN
  Administered 2011-11-08: 5 ug via INTRAVENOUS
  Administered 2011-11-08: 10 ug via INTRAVENOUS
  Administered 2011-11-08: 5 ug via INTRAVENOUS
  Administered 2011-11-08: 10 ug via INTRAVENOUS
  Administered 2011-11-08: 5 ug via INTRAVENOUS
  Administered 2011-11-08: 10 ug via INTRAVENOUS
  Administered 2011-11-08: 5 ug via INTRAVENOUS

## 2011-11-08 MED ORDER — ATROPINE SULFATE 0.4 MG/ML IJ SOLN
INTRAMUSCULAR | Status: DC | PRN
  Administered 2011-11-08: 0.4 mg via INTRAVENOUS

## 2011-11-08 MED ORDER — LACTATED RINGERS IV SOLN
INTRAVENOUS | Status: DC | PRN
  Administered 2011-11-08 (×3): via INTRAVENOUS

## 2011-11-08 MED ORDER — ONDANSETRON HCL 4 MG/2ML IJ SOLN
INTRAMUSCULAR | Status: DC | PRN
  Administered 2011-11-08: 4 mg via INTRAVENOUS

## 2011-11-08 MED ORDER — HYDROMORPHONE HCL PF 1 MG/ML IJ SOLN
INTRAMUSCULAR | Status: AC
  Filled 2011-11-08: qty 1

## 2011-11-08 MED ORDER — DIPHENHYDRAMINE HCL 12.5 MG/5ML PO ELIX: 12.5000 mg | ORAL_SOLUTION | Freq: Four times a day (QID) | ORAL | Status: DC | PRN

## 2011-11-08 MED ORDER — DEXTROSE-NACL 5-0.45 % IV SOLN
INTRAVENOUS | Status: DC
  Administered 2011-11-08: 1000 mL via INTRAVENOUS
  Administered 2011-11-08 – 2011-11-10 (×4): via INTRAVENOUS

## 2011-11-08 MED ORDER — LACTATED RINGERS IR SOLN
Status: DC | PRN
  Administered 2011-11-08: 1000 mL

## 2011-11-08 MED ORDER — LIDOCAINE HCL (CARDIAC) 20 MG/ML IV SOLN
INTRAVENOUS | Status: DC | PRN
  Administered 2011-11-08: 50 mg via INTRAVENOUS

## 2011-11-08 MED ORDER — ACETAMINOPHEN 10 MG/ML IV SOLN
INTRAVENOUS | Status: DC | PRN
  Administered 2011-11-08: 1000 mg via INTRAVENOUS

## 2011-11-08 MED ORDER — PROMETHAZINE HCL 25 MG/ML IJ SOLN: 6.2500 mg | INTRAMUSCULAR | Status: DC | PRN

## 2011-11-08 MED ORDER — HYDROMORPHONE HCL PF 1 MG/ML IJ SOLN
0.5000 mg | INTRAMUSCULAR | Status: DC | PRN
  Administered 2011-11-08 – 2011-11-10 (×7): 1 mg via INTRAVENOUS
  Filled 2011-11-08 (×7): qty 1

## 2011-11-08 MED ORDER — NEOSTIGMINE METHYLSULFATE 1 MG/ML IJ SOLN
INTRAMUSCULAR | Status: DC | PRN
  Administered 2011-11-08: 4 mg via INTRAVENOUS

## 2011-11-08 MED ORDER — MIDAZOLAM HCL 5 MG/5ML IJ SOLN
INTRAMUSCULAR | Status: DC | PRN
  Administered 2011-11-08: 2 mg via INTRAVENOUS

## 2011-11-08 MED ORDER — CISATRACURIUM BESYLATE (PF) 10 MG/5ML IV SOLN
INTRAVENOUS | Status: DC | PRN
  Administered 2011-11-08: 4 mg via INTRAVENOUS
  Administered 2011-11-08: 10 mg via INTRAVENOUS
  Administered 2011-11-08: 4 mg via INTRAVENOUS
  Administered 2011-11-08: 2 mg via INTRAVENOUS

## 2011-11-08 MED ORDER — VANCOMYCIN HCL IN DEXTROSE 1-5 GM/200ML-% IV SOLN
INTRAVENOUS | Status: AC
  Filled 2011-11-08: qty 200

## 2011-11-08 MED ORDER — VANCOMYCIN HCL IN DEXTROSE 1-5 GM/200ML-% IV SOLN
1000.0000 mg | Freq: Two times a day (BID) | INTRAVENOUS | Status: AC
  Administered 2011-11-08: 1000 mg via INTRAVENOUS
  Filled 2011-11-08 (×2): qty 200

## 2011-11-08 MED ORDER — DEXAMETHASONE SODIUM PHOSPHATE 10 MG/ML IJ SOLN
INTRAMUSCULAR | Status: DC | PRN
  Administered 2011-11-08: 10 mg via INTRAVENOUS

## 2011-11-08 MED ORDER — ONDANSETRON HCL 4 MG/2ML IJ SOLN
4.0000 mg | INTRAMUSCULAR | Status: DC | PRN
  Administered 2011-11-10: 4 mg via INTRAVENOUS
  Filled 2011-11-08 (×2): qty 2

## 2011-11-08 MED ORDER — VANCOMYCIN HCL IN DEXTROSE 1-5 GM/200ML-% IV SOLN
1000.0000 mg | INTRAVENOUS | Status: AC
  Administered 2011-11-08: 1000 mg via INTRAVENOUS

## 2011-11-08 MED ORDER — LACTATED RINGERS IV SOLN: INTRAVENOUS | Status: DC

## 2011-11-08 MED ORDER — HYDROMORPHONE HCL PF 1 MG/ML IJ SOLN
0.2500 mg | INTRAMUSCULAR | Status: DC | PRN
  Administered 2011-11-08 (×3): 0.5 mg via INTRAVENOUS

## 2011-11-08 MED ORDER — ACETAMINOPHEN 10 MG/ML IV SOLN
1000.0000 mg | Freq: Four times a day (QID) | INTRAVENOUS | Status: AC
  Administered 2011-11-08 – 2011-11-09 (×4): 1000 mg via INTRAVENOUS
  Filled 2011-11-08 (×4): qty 100

## 2011-11-08 MED ORDER — FLUTICASONE PROPIONATE 50 MCG/ACT NA SUSP
1.0000 | Freq: Every day | NASAL | Status: DC
  Administered 2011-11-08 – 2011-11-11 (×4): 1 via NASAL
  Filled 2011-11-08: qty 16

## 2011-11-08 MED ORDER — ZOLPIDEM TARTRATE 5 MG PO TABS: 5.0000 mg | ORAL_TABLET | Freq: Every evening | ORAL | Status: DC | PRN

## 2011-11-08 MED ORDER — DIPHENHYDRAMINE HCL 50 MG/ML IJ SOLN
12.5000 mg | Freq: Four times a day (QID) | INTRAMUSCULAR | Status: DC | PRN
  Filled 2011-11-08: qty 0.25

## 2011-11-08 MED ORDER — PROPOFOL 10 MG/ML IV BOLUS
INTRAVENOUS | Status: DC | PRN
  Administered 2011-11-08: 150 mg via INTRAVENOUS

## 2011-11-08 SURGICAL SUPPLY — 65 items
ADH SKN CLS APL DERMABOND .7 (GAUZE/BANDAGES/DRESSINGS) ×2
APL SKNCLS STERI-STRIP NONHPOA (GAUZE/BANDAGES/DRESSINGS) ×1
BAG SPEC THK2 15X12 ZIP CLS (MISCELLANEOUS)
BAG ZIPLOCK 12X15 (MISCELLANEOUS) ×1 IMPLANT
BENZOIN TINCTURE PRP APPL 2/3 (GAUZE/BANDAGES/DRESSINGS) ×2 IMPLANT
BLADE EXTENDED COATED 6.5IN (ELECTRODE) IMPLANT
BLADE SURG SZ10 CARB STEEL (BLADE) ×2 IMPLANT
CANISTER SUCTION 2500CC (MISCELLANEOUS) ×1 IMPLANT
CHLORAPREP W/TINT 26ML (MISCELLANEOUS) ×2 IMPLANT
CLIP LIGATING HEM O LOK PURPLE (MISCELLANEOUS) ×4 IMPLANT
CLIP LIGATING HEMO LOK XL GOLD (MISCELLANEOUS) IMPLANT
CLIP LIGATING HEMO O LOK GREEN (MISCELLANEOUS) ×3 IMPLANT
CLOTH BEACON ORANGE TIMEOUT ST (SAFETY) ×2 IMPLANT
COVER SURGICAL LIGHT HANDLE (MISCELLANEOUS) ×2 IMPLANT
CUTTER FLEX LINEAR 45M (STAPLE) ×2 IMPLANT
DERMABOND ADVANCED (GAUZE/BANDAGES/DRESSINGS) ×2
DERMABOND ADVANCED .7 DNX12 (GAUZE/BANDAGES/DRESSINGS) ×1 IMPLANT
DRAIN CHANNEL 10F 3/8 F FF (DRAIN) IMPLANT
DRAPE CAMERA CLOSED 9X96 (DRAPES) ×1 IMPLANT
DRAPE INCISE IOBAN 66X45 STRL (DRAPES) ×2 IMPLANT
DRAPE LAPAROSCOPIC ABDOMINAL (DRAPES) ×2 IMPLANT
DRAPE WARM FLUID 44X44 (DRAPE) IMPLANT
DRSG TEGADERM 4X4.75 (GAUZE/BANDAGES/DRESSINGS) ×1 IMPLANT
ELECT REM PT RETURN 9FT ADLT (ELECTROSURGICAL) ×2
ELECTRODE REM PT RTRN 9FT ADLT (ELECTROSURGICAL) ×1 IMPLANT
EVACUATOR SILICONE 100CC (DRAIN) IMPLANT
FLOSEAL (HEMOSTASIS) IMPLANT
FLOSEAL 10ML (HEMOSTASIS) ×1 IMPLANT
GLOVE BIOGEL M STRL SZ7.5 (GLOVE) ×4 IMPLANT
GOWN STRL NON-REIN LRG LVL3 (GOWN DISPOSABLE) ×5 IMPLANT
HEMOSTAT SURGICEL 4X8 (HEMOSTASIS) IMPLANT
KIT BASIN OR (CUSTOM PROCEDURE TRAY) ×2 IMPLANT
PENCIL BUTTON HOLSTER BLD 10FT (ELECTRODE) ×2 IMPLANT
POSITIONER SURGICAL ARM (MISCELLANEOUS) ×4 IMPLANT
POUCH ENDO CATCH II 15MM (MISCELLANEOUS) ×3 IMPLANT
RELOAD 45 VASCULAR/THIN (ENDOMECHANICALS) ×2 IMPLANT
RETRACTOR LAPSCP 12X46 CVD (ENDOMECHANICALS) IMPLANT
RTRCTR LAPSCP 12X46 CVD (ENDOMECHANICALS)
SCALPEL HARMONIC ACE (MISCELLANEOUS) ×2 IMPLANT
SCISSORS LAP 5X35 DISP (ENDOMECHANICALS) IMPLANT
SET IRRIG TUBING LAPAROSCOPIC (IRRIGATION / IRRIGATOR) ×2 IMPLANT
SOLUTION ANTI FOG 6CC (MISCELLANEOUS) ×2 IMPLANT
SPONGE GAUZE 4X4 12PLY (GAUZE/BANDAGES/DRESSINGS) ×1 IMPLANT
SPONGE LAP 18X18 X RAY DECT (DISPOSABLE) ×3 IMPLANT
SPONGE LAP 4X18 X RAY DECT (DISPOSABLE) ×2 IMPLANT
SPONGE SURGIFOAM ABS GEL 100 (HEMOSTASIS) IMPLANT
STRIP CLOSURE SKIN 1/4X4 (GAUZE/BANDAGES/DRESSINGS) ×1 IMPLANT
SUT ETHILON 3 0 PS 1 (SUTURE) IMPLANT
SUT MNCRL AB 4-0 PS2 18 (SUTURE) ×4 IMPLANT
SUT PDS AB 1 CTX 36 (SUTURE) ×4 IMPLANT
SUT VIC AB 2-0 SH 27 (SUTURE)
SUT VIC AB 2-0 SH 27X BRD (SUTURE) IMPLANT
SUT VICRYL 0 UR6 27IN ABS (SUTURE) ×6 IMPLANT
TAPE STRIPS DRAPE STRL (GAUZE/BANDAGES/DRESSINGS) ×2 IMPLANT
TOWEL OR 17X26 10 PK STRL BLUE (TOWEL DISPOSABLE) ×2 IMPLANT
TOWEL OR NON WOVEN STRL DISP B (DISPOSABLE) ×2 IMPLANT
TRAY FOLEY CATH 14FRSI W/METER (CATHETERS) ×2 IMPLANT
TRAY LAP CHOLE (CUSTOM PROCEDURE TRAY) ×2 IMPLANT
TROCAR BLADELESS OPT 5 100 (ENDOMECHANICALS) ×2 IMPLANT
TROCAR BLADELESS OPT 5 75 (ENDOMECHANICALS) ×1 IMPLANT
TROCAR ENDOPATH XCEL 12X100 BL (ENDOMECHANICALS) ×1 IMPLANT
TROCAR XCEL 12X100 BLDLESS (ENDOMECHANICALS) ×2 IMPLANT
TROCAR XCEL BLUNT TIP 100MML (ENDOMECHANICALS) ×2 IMPLANT
TUBING INSUFFLATION 10FT LAP (TUBING) ×2 IMPLANT
YANKAUER SUCT BULB TIP 10FT TU (MISCELLANEOUS) ×1 IMPLANT

## 2011-11-08 NOTE — Op Note (Signed)
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  Right laparoscopic radical nephrectomy 2.  Excision of suture sinus  Surgeon: Moody Bruins. M.D.  Assistant(s):  Pecola Leisure, PA-C  Anesthesia: General  Complications: None  EBL: 50 mL  IVF:  2500 mL crystalloid  Specimens: 1. Right kidney  Disposition of specimens: Pathology  Indication: [name] is a 46 y.o. patient with a right renal tumor suspicious for malignancy. She also has a non-healing area from her prior abdominal surgery felt to be a suture sinus.  After a thorough review of the management options for their renal mass, they elected to proceed with surgical treatment and the above procedure.  We have discussed the potential benefits and risks of the procedure, side effects of the proposed treatment, the likelihood of the patient achieving the goals of the procedure, and any potential problems that might occur during the procedure or recuperation. Informed consent has been obtained.  Description of procedure:  The patient was taken to the operating room and a general anesthetic was administered. The patient was given preoperative antibiotics, placed in the right modified flank position, and prepped and draped in the usual sterile fashion. Next a preoperative timeout was performed.  A site was selected near the umbilicus for placement of the camera port. This was placed using a standard open Hassan technique which allowed entry into the peritoneal cavity under direct vision and without difficulty. A 12 mm Hassan cannula was placed and a pneumoperitoneum established. She initially became bradycardic after the pneumoperitoneum was established.  The pressure was let down and her heart rate improved.  The pneumoperitoneum was then gradually increased and she tolerated this well. Due to her prior abdominoplasty, her abdomen  was not able to distended very much adding difficulty to visualization. The camera was then used to inspect the abdomen and there was no evidence of any intra-abdominal injuries or other abnormalities. The remaining abdominal ports were then placed. A 12 mm port was placed in the right lower quadrant and a 5 mm port was placed in the right upper quadrant.  All ports were placed under direct vision without difficulty.  Utilizing the harmonic scalpel, the white line of Toldt was incised allowing the colon to be mobilized medially and the plane between the mesocolon and the anterior layer of Gerota's fascia to be developed and the kidney exposed.  The ureter and gonadal vein were identified inferiorly and the ureter was lifted anteriorly off the psoas muscle.  Dissection proceeded superiorly along the gonadal vein until the renal vein was identified.  The renal hilum was then carefully isolated with a combination of blunt and sharp dissectiong allowing the renal arterial and venous structures to be separated and isolated.   The renal artery was isolated and ligated with multiple Weck clips and subsequently divided.  The renal vein was then isolated and also ligated and divided with a 45 mm Flex ETS stapler.  Gerota's fascia was intentionally entered superiorly and the space between the adrenal gland and the kidney was developed allowing the adrenal gland to be spared.  The hepatorenal ligaments were divided with the harmonic scalpel.  The lateral and posterior attachements to the kidney were then divided.  The ureter was ligated with Weck clips and divided allowing the specimen to be freed from all surrounding structures.  The kidney specimen was then placed into a 15 mm Endocatch II retrieval bag.  The renal hilum, liver, adrenal bed and gonadal vein areas were each inspected and hemostasis was ensured with the pneomperitoneal pressures lowered.  The 12 mm lower quadrant port was then closed with a 0-vicryl  suture placed laparoscopically to close the fascia of this incision. All remaining ports were removed under direct vision.  The kidney specimen was removed with the help of the retrieval bag via the camera port site after this incision was extended slightly. This fascial opening was then closed with two #1 PDS sutures.    Attention the turned to the lower abdomen where a small non-healing cutaneous wound was identified away from the port sites. There was no drainage.  An elliptical incision was made around the wound and taken down into the subcutaneous tissues.  There was a small piece of prolene suture which was also removed.   All incisions were injected with local anesthetic (Exparel) and reapproximated at the skin with 4-0 monocryl sutures.  Dermabond was applied to the skin. The patient tolerated the procedure well and without complications and was transferred to the recovery unit in satisfactory condition.   Moody Bruins MD

## 2011-11-08 NOTE — Care Management Note (Signed)
    Page 1 of 1   11/11/2011     12:38:06 PM   CARE MANAGEMENT NOTE 11/11/2011  Patient:  Natalie Olsen, Natalie Olsen   Account Number:  [id number]  Date Initiated:  11/08/2011  Documentation initiated by:  Lanier Clam  Subjective/Objective Assessment:   ADMITTED W/R QUAD PAIN.RENAL NEOPLASM     Action/Plan:   FROM HOME   Anticipated DC Date:  11/11/2011   Anticipated DC Plan:  HOME/SELF CARE      DC Planning Services  CM consult      Choice offered to / List presented to:             Status of service:  Completed, signed off Medicare Important Message given?   (If response is "NO", the following Medicare IM given date fields will be blank) Date Medicare IM given:   Date Additional Medicare IM given:    Discharge Disposition:  HOME/SELF CARE  Per UR Regulation:  Reviewed for med. necessity/level of care/duration of stay  If discussed at Long Length of Stay Meetings, dates discussed:    Comments:  11/08/11 [facility] RN,BSN NCM [phone]

## 2011-11-08 NOTE — Transfer of Care (Signed)
Immediate Anesthesia Transfer of Care Note  Patient: Natalie Olsen  Procedure(s) Performed: Procedure(s) (LRB): LAPAROSCOPIC NEPHRECTOMY (Right)  Patient Location: PACU  Anesthesia Type: General  Level of Consciousness: awake, sedated and patient cooperative  Airway & Oxygen Therapy: Patient Spontanous Breathing and Patient connected to face mask oxygen  Post-op Assessment: Report given to PACU RN and Post -op Vital signs reviewed and stable  Post vital signs: Reviewed and stable  Complications: No apparent anesthesia complications

## 2011-11-08 NOTE — Interval H&P Note (Signed)
History and Physical Interval Note:  11/08/2011 7:14 AM  Natalie Olsen RALPH BENAVIDEZ  has presented today for surgery, with the diagnosis of RIGHT RENAL NEOPLASM, SUTURE SINUS  The various methods of treatment have been discussed with the patient and family. After consideration of risks, benefits and other options for treatment, the patient has consented to  Procedure(s) (LRB): LAPAROSCOPIC NEPHRECTOMY (Right) and excision of suture sinus as a surgical intervention .  The patient's history has been reviewed, patient examined, no change in status, stable for surgery.  I have reviewed the patient's chart and labs.  Questions were answered to the patient's satisfaction.     Rosalie Gelpi,LES

## 2011-11-08 NOTE — Anesthesia Preprocedure Evaluation (Signed)
Anesthesia Evaluation  Patient identified by MRN, date of birth, ID band Patient awake    Reviewed: Allergy & Precautions, H&P , NPO status , Patient's Chart, lab work & pertinent test results  Airway Mallampati: II TM Distance: <3 FB Neck ROM: Full    Dental No notable dental hx. (+) Dental Advisory Given and Teeth Intact   Pulmonary neg pulmonary ROS,  breath sounds clear to auscultation  Pulmonary exam normal       Cardiovascular hypertension, Pt. on medications Rhythm:Regular Rate:Normal     Neuro/Psych negative neurological ROS  negative psych ROS   GI/Hepatic negative GI ROS, Neg liver ROS,   Endo/Other  negative endocrine ROS  Renal/GU negative Renal ROS  negative genitourinary   Musculoskeletal negative musculoskeletal ROS (+)   Abdominal   Peds negative pediatric ROS (+)  Hematology negative hematology ROS (+)   Anesthesia Other Findings   Reproductive/Obstetrics negative OB ROS                           Anesthesia Physical Anesthesia Plan  ASA: II  Anesthesia Plan: General   Post-op Pain Management:    Induction: Intravenous  Airway Management Planned: Oral ETT  Additional Equipment:   Intra-op Plan:   Post-operative Plan: Extubation in OR  Informed Consent: I have reviewed the patients History and Physical, chart, labs and discussed the procedure including the risks, benefits and alternatives for the proposed anesthesia with the patient or authorized representative who has indicated his/her understanding and acceptance.   Dental advisory given  Plan Discussed with: CRNA  Anesthesia Plan Comments:         Anesthesia Quick Evaluation

## 2011-11-08 NOTE — Anesthesia Postprocedure Evaluation (Signed)
Anesthesia Post Note  Patient: Natalie Olsen  Procedure(s) Performed: Procedure(s) (LRB): LAPAROSCOPIC NEPHRECTOMY (Right)  Anesthesia type: General  Patient location: PACU  Post pain: Pain level controlled  Post assessment: Post-op Vital signs reviewed  Last Vitals:  Filed Vitals:   11/08/11 1100  BP: 122/72  Pulse: 62  Temp:   Resp: 10    Post vital signs: Reviewed  Level of consciousness: sedated  Complications: No apparent anesthesia complications

## 2011-11-08 NOTE — Progress Notes (Signed)
Patient ID: DEEPTI GUNAWAN, female   DOB: 08-03-1965, 46 y.o.   MRN: [id number]  Post-op note  Subjective: The patient is doing well.  No complaints.  Objective: Vital signs in last 24 hours: Temp:  [97.1 F (36.2 C)-98 F (36.7 C)] 97.6 F (36.4 C) (07/22 1555) Pulse Rate:  [55-68] 57  (07/22 1555) Resp:  [9-16] 16  (07/22 1555) BP: (119-135)/(68-107) 125/80 mmHg (07/22 1555) SpO2:  [97 %-100 %] 100 % (07/22 1555) Weight:  [70.7 kg (155 lb 13.8 oz)] 70.7 kg (155 lb 13.8 oz) (07/22 1130)  Intake/Output from previous day:   Intake/Output this shift: Total I/O In: 3610 [I.V.:3510; IV Piggyback:100] Out: 735 [Urine:685; Blood:50]  Physical Exam:  General: Alert and oriented. Abdomen: Soft, Nondistended. Incisions: Clean and dry.  Lab Results:  Basename 11/08/11 1027  HGB 14.0  HCT 41.4    Assessment/Plan: POD#0   1) Continue to monitor   Moody Bruins. MD   LOS: 0 days   Sameria Morss,LES 11/08/2011, 5:50 PM

## 2011-11-09 ENCOUNTER — Encounter (HOSPITAL_COMMUNITY): Payer: Self-pay | Admitting: Urology

## 2011-11-09 LAB — BASIC METABOLIC PANEL
BUN: 12 mg/dL (ref 6–23)
Chloride: 103 mEq/L (ref 96–112)
Creatinine, Ser: 1.37 mg/dL — ABNORMAL HIGH (ref 0.50–1.10)
Glucose, Bld: 136 mg/dL — ABNORMAL HIGH (ref 70–99)
Potassium: 3.9 mEq/L (ref 3.5–5.1)

## 2011-11-09 MED ORDER — HYDROCODONE-ACETAMINOPHEN 5-325 MG PO TABS
1.0000 | ORAL_TABLET | Freq: Four times a day (QID) | ORAL | Status: DC | PRN
  Administered 2011-11-09 (×2): 1 via ORAL
  Filled 2011-11-09: qty 2
  Filled 2011-11-09: qty 1

## 2011-11-09 MED ORDER — DOCUSATE SODIUM 100 MG PO CAPS
100.0000 mg | ORAL_CAPSULE | Freq: Two times a day (BID) | ORAL | Status: DC
  Administered 2011-11-09 – 2011-11-11 (×3): 100 mg via ORAL
  Filled 2011-11-09 (×6): qty 1

## 2011-11-09 MED ORDER — TRAMADOL HCL 50 MG PO TABS
100.0000 mg | ORAL_TABLET | Freq: Four times a day (QID) | ORAL | Status: DC | PRN
  Administered 2011-11-10: 50 mg via ORAL
  Administered 2011-11-10: 100 mg via ORAL
  Filled 2011-11-09: qty 2
  Filled 2011-11-09: qty 1
  Filled 2011-11-09: qty 2

## 2011-11-09 MED ORDER — BISACODYL 10 MG RE SUPP
10.0000 mg | Freq: Once | RECTAL | Status: AC
  Administered 2011-11-09: 10 mg via RECTAL
  Filled 2011-11-09: qty 1

## 2011-11-09 NOTE — Progress Notes (Signed)
Patient ID: Natalie Olsen, female   DOB: December 15, 1965, 46 y.o.   MRN: [id number]  1 Day Post-Op Subjective: The patient is doing well.  No nausea or vomiting. Pain is adequately controlled but present at incision sites as expected.  Objective: Vital signs in last 24 hours: Temp:  [97.6 F (36.4 C)-98 F (36.7 C)] 97.7 F (36.5 C) (07/23 0500) Pulse Rate:  [55-72] 60  (07/23 0500) Resp:  [9-16] 16  (07/23 0500) BP: (119-135)/(68-107) 131/80 mmHg (07/23 0500) SpO2:  [97 %-100 %] 100 % (07/23 0500) Weight:  [70.7 kg (155 lb 13.8 oz)] 70.7 kg (155 lb 13.8 oz) (07/22 1130)  Intake/Output from previous day: 07/22 0701 - 07/23 0700 In: 6130 [P.O.:420; I.V.:5310; IV Piggyback:400] Out: 2335 [Urine:2285; Blood:50] Intake/Output this shift:    Physical Exam:  General: Alert and oriented. CV: RRR Lungs: Clear bilaterally. GI: Soft, Nondistended. Incisions: Clean and dry. Urine: Clear Extremities: Nontender, no erythema, no edema.  Lab Results:  Basename 11/09/11 0500 11/08/11 1027  HGB 11.9* 14.0  HCT 36.5 41.4          Basename 11/09/11 0500 11/08/11 1027 11/05/11 1220  CREATININE 1.37* 0.95 0.84           Results for orders placed during the hospital encounter of 11/08/11 (from the past 24 hour(s))  BASIC METABOLIC PANEL     Status: Abnormal   Collection Time   11/08/11 10:27 AM      Component Value Range   Sodium 136  135 - 145 mEq/L   Potassium 4.0  3.5 - 5.1 mEq/L   Chloride 101  96 - 112 mEq/L   CO2 27  19 - 32 mEq/L   Glucose, Bld 110 (*) 70 - 99 mg/dL   BUN 15  6 - 23 mg/dL   Creatinine, Ser 4.09  0.50 - 1.10 mg/dL   Calcium 8.6  8.4 - 81.1 mg/dL   GFR calc non Af Amer 70 (*) >90 mL/min   GFR calc Af Amer 81 (*) >90 mL/min  HEMOGLOBIN AND HEMATOCRIT, BLOOD     Status: Normal   Collection Time   11/08/11 10:27 AM      Component Value Range   Hemoglobin 14.0  12.0 - 15.0 g/dL   HCT 91.4  78.2 - 95.6 %  BASIC METABOLIC PANEL     Status: Abnormal   Collection  Time   11/09/11  5:00 AM      Component Value Range   Sodium 137  135 - 145 mEq/L   Potassium 3.9  3.5 - 5.1 mEq/L   Chloride 103  96 - 112 mEq/L   CO2 27  19 - 32 mEq/L   Glucose, Bld 136 (*) 70 - 99 mg/dL   BUN 12  6 - 23 mg/dL   Creatinine, Ser 2.13 (*) 0.50 - 1.10 mg/dL   Calcium 8.5  8.4 - 08.6 mg/dL   GFR calc non Af Amer 45 (*) >90 mL/min   GFR calc Af Amer 52 (*) >90 mL/min  HEMOGLOBIN AND HEMATOCRIT, BLOOD     Status: Abnormal   Collection Time   11/09/11  5:00 AM      Component Value Range   Hemoglobin 11.9 (*) 12.0 - 15.0 g/dL   HCT 57.8  46.9 - 62.9 %    Assessment/Plan: POD# 1 s/p laparoscopic nephrectomy.  1) Ambulate, Incentive spirometry 2) Advance diet as tolerated 3) Transition to oral pain medication 4) Dulcolax suppository 5) D/C urethral catheter  Rolly Salter, Montez Hageman. MD   LOS: 1 day   Ivy Puryear,LES 11/09/2011, 7:33 AM

## 2011-11-10 LAB — BASIC METABOLIC PANEL
CO2: 26 mEq/L (ref 19–32)
Glucose, Bld: 101 mg/dL — ABNORMAL HIGH (ref 70–99)
Potassium: 3.6 mEq/L (ref 3.5–5.1)
Sodium: 137 mEq/L (ref 135–145)

## 2011-11-10 LAB — HEMOGLOBIN AND HEMATOCRIT, BLOOD: HCT: 34.4 % — ABNORMAL LOW (ref 36.0–46.0)

## 2011-11-10 MED ORDER — ACETAMINOPHEN 500 MG PO TABS
500.0000 mg | ORAL_TABLET | Freq: Four times a day (QID) | ORAL | Status: DC | PRN
  Administered 2011-11-10: 500 mg via ORAL
  Filled 2011-11-10: qty 1

## 2011-11-10 MED ORDER — TRAMADOL HCL 50 MG PO TABS
50.0000 mg | ORAL_TABLET | Freq: Four times a day (QID) | ORAL | Status: DC | PRN
  Administered 2011-11-10 – 2011-11-11 (×2): 50 mg via ORAL
  Filled 2011-11-10: qty 1

## 2011-11-10 NOTE — Progress Notes (Signed)
Patient ID: Natalie Olsen, female   DOB: Aug 04, 1965, 46 y.o.   MRN: [id number]  Pt with nausea earlier today.  She was not discharged for this reason.  She is now doing better and was able to tolerate some solid food for dinner.  She is tolerating one tablet of tramadol which is also managing her pain.  Likely will plan for d/c in the morning after breakfast.

## 2011-11-10 NOTE — Progress Notes (Signed)
Pt. IV was SL this am. She was encouraged to have PO pain medication. At [phone], patient received Tramadol PO with her breakfast and approximately 5-10 minutes later she started dry heaving and vomited x1. Patient was given Zofran IV. Will continue to monitor. Pete Schnitzer, Joslyn Devon

## 2011-11-10 NOTE — Progress Notes (Signed)
Patient ID: Natalie Olsen, female   DOB: 1965/09/27, 46 y.o.   MRN: [id number]  2 Days Post-Op Subjective: Pt doing well overall.  No flatus.  No nausea or vomiting.  Pain medications switched to tramadol last night since hydrocodone was causing headache. However, she only took IV pain meds through the night. Tolerating soft diet.  Objective: Vital signs in last 24 hours: Temp:  [97.6 F (36.4 C)-98.5 F (36.9 C)] 98.5 F (36.9 C) (07/24 0446) Pulse Rate:  [61-77] 77  (07/24 0446) Resp:  [18-20] 20  (07/24 0446) BP: (105-129)/(69-85) 122/85 mmHg (07/24 0446) SpO2:  [90 %-95 %] 95 % (07/24 0446)  Intake/Output from previous day: 07/23 0701 - 07/24 0700 In: 1292.5 [I.V.:1292.5] Out: 3000 [Urine:3000] Intake/Output this shift: Total I/O In: -  Out: 850 [Urine:850]  Physical Exam:  General: Alert and oriented CV: RRR Lungs: Clear Abdomen: Soft, ND Incisions: C/D/I Ext: NT, No erythema  Lab Results:  Basename 11/10/11 0440 11/09/11 0500 11/08/11 1027  HGB 11.2* 11.9* 14.0  HCT 34.4* 36.5 41.4   BMET  Basename 11/10/11 0440 11/09/11 0500  NA 137 137  K 3.6 3.9  CL 103 103  CO2 26 27  GLUCOSE 101* 136*  BUN 10 12  CREATININE 1.34* 1.37*  CALCIUM 8.5 8.5   Path: pT1b Nx Mx, 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  Assessment/Plan: -- SL IVF -- Regular diet -- She will try oral pain medication this morning -- If doing well later this morning, will hopefully discharge home then -- Pathology results reviewed with Ms. Reading   LOS: 2 days   Deloy Archey,LES 11/10/2011, 6:43 AM

## 2011-11-11 MED ORDER — TRAMADOL HCL 50 MG PO TABS: 50.0000 mg | ORAL_TABLET | Freq: Four times a day (QID) | ORAL | Status: AC | PRN

## 2011-11-11 MED ORDER — DOCUSATE SODIUM 100 MG PO CAPS: 100.0000 mg | ORAL_CAPSULE | Freq: Two times a day (BID) | ORAL | Status: AC

## 2011-11-11 NOTE — Discharge Summary (Signed)
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 Briefly, Natalie Olsen is a 46 y.o. year old patient who presented to the ED on 10/06/11 with severe right lower quadrant pain associated with nausea and diaphoresis. A CT scan of the abdomen with contrast was performed which demonstrated no obvious cause for her right lower quadrant pain but did demonstrate a 4.2 cm centrally located, heterogeneous mass in the right kidney which appeared to be enhancing. She was also incidentally noted to have a benign appearing 3.4 cm left ovarian cyst as well as lesions in the liver and spleen. Her pain subsequently improved spontaneously. She has denied hematuria and has no family history of kidney cancer. She was seen by Dr. Vernie Ammons who ordered an MRI which was performed on 6/25 and confirmed a 3.9 cm heterogeneous and clearly enhancing mass near the renal hilum of the right kidney. There are areas of central hypointensity likely indicating central necrosis. There is no regional lymphadenopathy, contralateral renal abnormalities, renal vein/IVC involvement, adrenal abnormalities, or other evidence of abdominal metastases. Her liver lesion was consistent with a 2 cm hemangioma and her spleen lesion appeared to be 1 cm splenic cyst which each lesion appearing benign and not likely to be metastatic disease. A chest x-ray from 6/25 was also performed and was negative for metastases to the chest. Due to the central location of this mass, Dr. Vernie Ammons proceeded with right retrograde pyelography and ureteroscopy which was unremarkable for evidence of urothelial carcinoma indicating that her renal  lesion was most worrisome for renal cell carcinoma. While a multilocular cystic nephroma would also be in the differential diagnosis, this lesion is more concerning for renal malignancy based on review of her imaging studies today.  She has no risk factors for renal disease and her baseline renal function is normal. Baseline Cr is 0.71.  She also has undergone a prior abdominoplasty approximately 7 years ago. 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 She is interested in potentially having this addressed at the time of her renal surgery.    Hospital Course: Pt was admitted and taken to the OR on 11/08/11 for a laparoscopic right radical nephrectomy.  Pt tolerated the procedure well and was hemodynamically stable immediately post op.  She was extubated without complication and woke up from anesthesia neurologically intact. She was transferred to the PACU and then to the floor without difficulty.  Her post op course progressed as expected with the exception of some prolonged nausea and vomiting.  This was treated appropriately and resolved by POD #3.  She remained afebrile with stable vitals throughout the post op course.  She was voiding, ambulating, and bowel function had returned by the time of discharge.  On POD #3 she was feeling well and ready for d/c home.   Laboratory values:  Basename 11/10/11 0440 11/09/11 0500  HGB 11.2* 11.9*  HCT 34.4* 36.5    Basename 11/10/11 0440 11/09/11 0500  CREATININE 1.34* 1.37*    Disposition: Home  Discharge instruction: The patient was instructed to be ambulatory but told to refrain from heavy lifting, strenuous activity, or driving.   Discharge medications:  Medication List  As of 11/11/2011  2:47 PM   START taking these medications         docusate sodium 100 MG capsule   Commonly known as: COLACE   Take 1 capsule (100 mg total) by mouth 2 (two) times daily.        traMADol 50 MG tablet   Commonly known as: ULTRAM   Take 1 tablet (50 mg total) by mouth every 6 (six) hours as needed for pain.         CONTINUE taking these medications         citalopram 10 MG tablet   Commonly known as: CELEXA      fluticasone 50 MCG/ACT nasal spray   Commonly known as: FLONASE      losartan-hydrochlorothiazide 50-12.5 MG per tablet   Commonly known as: HYZAAR          Where to get your medications    These are the prescriptions that you need to pick up. We sent them to a specific pharmacy, so you will need to go there to get them.   [id number] [place], [place] - [address]    [address] [place] [address]    Phone: [phone]        traMADol 50 MG tablet         You may get these medications from any pharmacy.         docusate sodium 100 MG capsule            Followup:  Follow-up Information    Follow up with Crecencio Mc, MD on 11/30/2011. (at 12:00)    Contact information:   [address] [place] [facility] [place] [address] [phone]

## 2011-11-11 NOTE — Progress Notes (Signed)
Patient ID: Natalie Olsen, female   DOB: September 30, 1965, 46 y.o.   MRN: [id number]  3 Days Post-Op Subjective: Nausea improved.  Tolerating diet better.  Pain controlled.  Objective: Vital signs in last 24 hours: Temp:  [97.3 F (36.3 C)-98 F (36.7 C)] 98 F (36.7 C) (07/25 0505) Pulse Rate:  [84-87] 87  (07/25 0505) Resp:  [16] 16  (07/25 0505) BP: (141-151)/(92-96) 144/96 mmHg (07/25 0505) SpO2:  [91 %-94 %] 91 % (07/25 0505)  Intake/Output from previous day: 07/24 0701 - 07/25 0700 In: 480 [P.O.:480] Out: 1303 [Urine:1300; Emesis/NG output:1; Stool:2] Intake/Output this shift: Total I/O In: -  Out: 350 [Urine:350]  Physical Exam:  General: Alert and oriented CV: RRR Lungs: Clear Abdomen: Soft, ND, Positive BS Incisions: C/D/I Ext: NT, No erythema  Lab Results:  Basename 11/10/11 0440 11/09/11 0500 11/08/11 1027  HGB 11.2* 11.9* 14.0  HCT 34.4* 36.5 41.4   BMET  Basename 11/10/11 0440 11/09/11 0500  NA 137 137  K 3.6 3.9  CL 103 103  CO2 26 27  GLUCOSE 101* 136*  BUN 10 12  CREATININE 1.34* 1.37*  CALCIUM 8.5 8.5     Studies/Results: No results found.  Assessment/Plan: -- D/C home if tolerates breakfast   LOS: 3 days   Gaius Ishaq,LES 11/11/2011, 6:45 AM

## 2012-01-28 ENCOUNTER — Ambulatory Visit (HOSPITAL_COMMUNITY)
Admission: RE | Admit: 2012-01-28 | Discharge: 2012-01-28 | Disposition: A | Discharge status: Home / Self Care | Payer: BC Managed Care – PPO | Source: Ambulatory Visit | Attending: Otolaryngology | Admitting: Otolaryngology

## 2012-01-28 ENCOUNTER — Other Ambulatory Visit: Payer: Self-pay | Admitting: Otolaryngology

## 2012-01-28 ENCOUNTER — Encounter (HOSPITAL_COMMUNITY)
Admission: RE | Admit: 2012-01-28 | Discharge: 2012-01-28 | Disposition: A | Discharge status: Home / Self Care | Payer: BC Managed Care – PPO | Source: Ambulatory Visit | Attending: Otolaryngology | Admitting: Otolaryngology

## 2012-01-28 ENCOUNTER — Encounter (HOSPITAL_COMMUNITY): Payer: Self-pay

## 2012-01-28 DIAGNOSIS — Z01812 Encounter for preprocedural laboratory examination: Secondary | ICD-10-CM | POA: Insufficient documentation

## 2012-01-28 DIAGNOSIS — Z01810 Encounter for preprocedural cardiovascular examination: Secondary | ICD-10-CM | POA: Insufficient documentation

## 2012-01-28 DIAGNOSIS — Z01818 Encounter for other preprocedural examination: Secondary | ICD-10-CM | POA: Insufficient documentation

## 2012-01-28 HISTORY — DX: Malignant (primary) neoplasm, unspecified: C80.1

## 2012-01-28 HISTORY — DX: Allergy status to unspecified drugs, medicaments and biological substances: Z88.9

## 2012-01-28 LAB — HCG, SERUM, QUALITATIVE: Preg, Serum: NEGATIVE

## 2012-01-28 LAB — SURGICAL PCR SCREEN
MRSA, PCR: NEGATIVE
Staphylococcus aureus: NEGATIVE

## 2012-01-28 NOTE — Pre-Procedure Instructions (Signed)
20 Natalie Olsen  01/28/2012   Your procedure is scheduled on:  Wednesday October 16  Report to [facility] at 0815 AM.  Call this number if you have problems the morning of surgery: [phone]   Remember:   Do not eat food or drink liquids:After Midnight.     Take these medicines the morning of surgery with A SIP OF WATER: *none**   Do not wear jewelry, make-up or nail polish.  Do not wear lotions, powders, or perfumes. You may wear deodorant.  Do not shave 48 hours prior to surgery. Men may shave face and neck.  Do not bring valuables to the hospital.  Contacts, dentures or bridgework may not be worn into surgery.  Leave suitcase in the car. After surgery it may be brought to your room.  For patients admitted to the hospital, checkout time is 11:00 AM the day of discharge.   Patients discharged the day of surgery will not be allowed to drive home.  Name and phone number of your driver:   Special Instructions: Shower using CHG 2 nights before surgery and the night before surgery.  If you shower the day of surgery use CHG.  Use special wash - you have one bottle of CHG for all showers.  You should use approximately 1/3 of the bottle for each shower.   Please read over the following fact sheets that you were given: Pain Booklet, Coughing and Deep Breathing, MRSA Information and Surgical Site Infection Prevention

## 2012-02-02 ENCOUNTER — Ambulatory Visit (HOSPITAL_COMMUNITY): Payer: BC Managed Care – PPO | Admitting: Anesthesiology

## 2012-02-02 ENCOUNTER — Ambulatory Visit (HOSPITAL_COMMUNITY)
Admission: RE | Admit: 2012-02-02 | Discharge: 2012-02-02 | Disposition: A | Discharge status: Home / Self Care | Payer: BC Managed Care – PPO | Source: Ambulatory Visit | Attending: Otolaryngology | Admitting: Otolaryngology

## 2012-02-02 ENCOUNTER — Encounter (HOSPITAL_COMMUNITY): Payer: Self-pay | Admitting: *Deleted

## 2012-02-02 ENCOUNTER — Encounter (HOSPITAL_COMMUNITY): Payer: Self-pay | Admitting: Anesthesiology

## 2012-02-02 ENCOUNTER — Encounter (HOSPITAL_COMMUNITY)
Admission: RE | Disposition: A | Discharge status: Home / Self Care | Payer: Self-pay | Source: Ambulatory Visit | Attending: Otolaryngology

## 2012-02-02 DIAGNOSIS — Z79899 Other long term (current) drug therapy: Secondary | ICD-10-CM | POA: Insufficient documentation

## 2012-02-02 DIAGNOSIS — J3489 Other specified disorders of nose and nasal sinuses: Secondary | ICD-10-CM | POA: Insufficient documentation

## 2012-02-02 DIAGNOSIS — Z85528 Personal history of other malignant neoplasm of kidney: Secondary | ICD-10-CM | POA: Insufficient documentation

## 2012-02-02 DIAGNOSIS — F411 Generalized anxiety disorder: Secondary | ICD-10-CM | POA: Insufficient documentation

## 2012-02-02 DIAGNOSIS — J343 Hypertrophy of nasal turbinates: Secondary | ICD-10-CM | POA: Insufficient documentation

## 2012-02-02 DIAGNOSIS — I10 Essential (primary) hypertension: Secondary | ICD-10-CM | POA: Insufficient documentation

## 2012-02-02 DIAGNOSIS — Q300 Choanal atresia: Secondary | ICD-10-CM | POA: Insufficient documentation

## 2012-02-02 DIAGNOSIS — R011 Cardiac murmur, unspecified: Secondary | ICD-10-CM | POA: Insufficient documentation

## 2012-02-02 DIAGNOSIS — J342 Deviated nasal septum: Secondary | ICD-10-CM | POA: Diagnosis present

## 2012-02-02 HISTORY — PX: SINUS ENDO W/FUSION: SHX777

## 2012-02-02 SURGERY — SINUS SURGERY, ENDOSCOPIC, USING COMPUTER-ASSISTED NAVIGATION
Anesthesia: General | Site: Nose | Laterality: Bilateral | Wound class: Clean Contaminated

## 2012-02-02 MED ORDER — LIDOCAINE HCL (CARDIAC) 20 MG/ML IV SOLN
INTRAVENOUS | Status: DC | PRN
  Administered 2012-02-02: 80 mg via INTRAVENOUS

## 2012-02-02 MED ORDER — LEVOFLOXACIN 500 MG PO TABS: 500.0000 mg | ORAL_TABLET | Freq: Every day | ORAL | Status: AC

## 2012-02-02 MED ORDER — DEXAMETHASONE SODIUM PHOSPHATE 10 MG/ML IJ SOLN
INTRAMUSCULAR | Status: DC | PRN
  Administered 2012-02-02: 10 mg via INTRAVENOUS

## 2012-02-02 MED ORDER — ARTIFICIAL TEARS OP OINT
TOPICAL_OINTMENT | OPHTHALMIC | Status: DC | PRN
  Administered 2012-02-02: 1 via OPHTHALMIC

## 2012-02-02 MED ORDER — EPHEDRINE SULFATE 50 MG/ML IJ SOLN
INTRAMUSCULAR | Status: DC | PRN
  Administered 2012-02-02: 5 mg via INTRAVENOUS

## 2012-02-02 MED ORDER — TRIAMCINOLONE ACETONIDE 40 MG/ML IJ SUSP
INTRAMUSCULAR | Status: AC
  Filled 2012-02-02: qty 1

## 2012-02-02 MED ORDER — MUPIROCIN CALCIUM 2 % EX CREA
TOPICAL_CREAM | CUTANEOUS | Status: DC | PRN
  Administered 2012-02-02: 1 via TOPICAL

## 2012-02-02 MED ORDER — OXYCODONE HCL 5 MG/5ML PO SOLN: 5.0000 mg | Freq: Once | ORAL | Status: DC | PRN

## 2012-02-02 MED ORDER — SODIUM CHLORIDE 0.9 % IR SOLN
Status: DC | PRN
  Administered 2012-02-02: 1000 mL

## 2012-02-02 MED ORDER — FENTANYL CITRATE 0.05 MG/ML IJ SOLN
INTRAMUSCULAR | Status: DC | PRN
  Administered 2012-02-02 (×3): 50 ug via INTRAVENOUS

## 2012-02-02 MED ORDER — MIDAZOLAM HCL 5 MG/5ML IJ SOLN
INTRAMUSCULAR | Status: DC | PRN
  Administered 2012-02-02: 2 mg via INTRAVENOUS

## 2012-02-02 MED ORDER — LIDOCAINE-EPINEPHRINE 1 %-1:100000 IJ SOLN
INTRAMUSCULAR | Status: DC | PRN
  Administered 2012-02-02: 7 mL

## 2012-02-02 MED ORDER — LIDOCAINE-EPINEPHRINE 1 %-1:100000 IJ SOLN
INTRAMUSCULAR | Status: AC
  Filled 2012-02-02: qty 1

## 2012-02-02 MED ORDER — OXYMETAZOLINE HCL 0.05 % NA SOLN
NASAL | Status: DC | PRN
  Administered 2012-02-02: 1 via NASAL

## 2012-02-02 MED ORDER — METOCLOPRAMIDE HCL 5 MG/ML IJ SOLN: 10.0000 mg | Freq: Once | INTRAMUSCULAR | Status: DC | PRN

## 2012-02-02 MED ORDER — SODIUM CHLORIDE 0.9 % IR SOLN
Status: DC | PRN
  Administered 2012-02-02: 1

## 2012-02-02 MED ORDER — PROPOFOL 10 MG/ML IV BOLUS
INTRAVENOUS | Status: DC | PRN
  Administered 2012-02-02: 200 mg via INTRAVENOUS

## 2012-02-02 MED ORDER — HYDROCODONE-ACETAMINOPHEN 5-325 MG PO TABS: 1.0000 | ORAL_TABLET | Freq: Four times a day (QID) | ORAL | Status: AC | PRN

## 2012-02-02 MED ORDER — ONDANSETRON HCL 4 MG/2ML IJ SOLN
INTRAMUSCULAR | Status: DC | PRN
  Administered 2012-02-02: 4 mg via INTRAVENOUS

## 2012-02-02 MED ORDER — MUPIROCIN CALCIUM 2 % EX CREA
TOPICAL_CREAM | CUTANEOUS | Status: AC
  Filled 2012-02-02: qty 15

## 2012-02-02 MED ORDER — OXYMETAZOLINE HCL 0.05 % NA SOLN
NASAL | Status: AC
  Filled 2012-02-02: qty 30

## 2012-02-02 MED ORDER — LIDOCAINE HCL 4 % MT SOLN
OROMUCOSAL | Status: DC | PRN
  Administered 2012-02-02: 4 mL via TOPICAL

## 2012-02-02 MED ORDER — HYDROMORPHONE HCL PF 1 MG/ML IJ SOLN: 0.2500 mg | INTRAMUSCULAR | Status: DC | PRN

## 2012-02-02 MED ORDER — OXYCODONE HCL 5 MG PO TABS: 5.0000 mg | ORAL_TABLET | Freq: Once | ORAL | Status: DC | PRN

## 2012-02-02 MED ORDER — LACTATED RINGERS IV SOLN
INTRAVENOUS | Status: DC | PRN
  Administered 2012-02-02: 10:00:00 via INTRAVENOUS

## 2012-02-02 MED ORDER — CEFAZOLIN SODIUM 1-5 GM-% IV SOLN
INTRAVENOUS | Status: AC
  Filled 2012-02-02: qty 100

## 2012-02-02 MED ORDER — ROCURONIUM BROMIDE 100 MG/10ML IV SOLN
INTRAVENOUS | Status: DC | PRN
  Administered 2012-02-02: 40 mg via INTRAVENOUS

## 2012-02-02 MED ORDER — GLYCOPYRROLATE 0.2 MG/ML IJ SOLN
INTRAMUSCULAR | Status: DC | PRN
  Administered 2012-02-02: 0.2 mg via INTRAVENOUS
  Administered 2012-02-02: 0.6 mg via INTRAVENOUS

## 2012-02-02 MED ORDER — NEOSTIGMINE METHYLSULFATE 1 MG/ML IJ SOLN
INTRAMUSCULAR | Status: DC | PRN
  Administered 2012-02-02: 4 mg via INTRAVENOUS
  Administered 2012-02-02: 1 mg via INTRAVENOUS

## 2012-02-02 MED ORDER — LACTATED RINGERS IV SOLN: INTRAVENOUS | Status: DC

## 2012-02-02 MED ORDER — CEFAZOLIN SODIUM 1-5 GM-% IV SOLN
1.0000 g | Freq: Three times a day (TID) | INTRAVENOUS | Status: DC
  Administered 2012-02-02: 2 g via INTRAVENOUS

## 2012-02-02 SURGICAL SUPPLY — 48 items
ATTRACTOMAT 16X20 MAGNETIC DRP (DRAPES) IMPLANT
BLADE ROTATE RAD 40 4 M4 (BLADE) IMPLANT
BLADE ROTATE TRICUT 4X13 M4 (BLADE) ×2 IMPLANT
CANISTER SUCTION 2500CC (MISCELLANEOUS) ×4 IMPLANT
CLOTH BEACON ORANGE TIMEOUT ST (SAFETY) ×2 IMPLANT
COAGULATOR SUCT SWTCH 10FR 6 (ELECTROSURGICAL) ×1 IMPLANT
DECANTER SPIKE VIAL GLASS SM (MISCELLANEOUS) ×2 IMPLANT
DRESSING NASAL KENNEDY 3.5X.9 (MISCELLANEOUS) IMPLANT
DRSG NASAL KENNEDY 3.5X.9 (MISCELLANEOUS)
ELECT COATED BLADE 2.86 ST (ELECTRODE) IMPLANT
ELECT REM PT RETURN 9FT ADLT (ELECTROSURGICAL) ×2
ELECTRODE REM PT RTRN 9FT ADLT (ELECTROSURGICAL) ×1 IMPLANT
FILTER ARTHROSCOPY CONVERTOR (FILTER) IMPLANT
GLOVE BIOGEL M 7.0 STRL (GLOVE) ×4 IMPLANT
GLOVE BIOGEL PI IND STRL 7.0 (GLOVE) IMPLANT
GLOVE BIOGEL PI INDICATOR 7.0 (GLOVE) ×1
GLOVE SURG SS PI 6.5 STRL IVOR (GLOVE) ×1 IMPLANT
GOWN STRL NON-REIN LRG LVL3 (GOWN DISPOSABLE) ×4 IMPLANT
KIT BASIN OR (CUSTOM PROCEDURE TRAY) ×2 IMPLANT
KIT ROOM TURNOVER OR (KITS) ×2 IMPLANT
NDL 18GX1X1/2 (RX/OR ONLY) (NEEDLE) IMPLANT
NEEDLE 18GX1X1/2 (RX/OR ONLY) (NEEDLE) IMPLANT
NS IRRIG 1000ML POUR BTL (IV SOLUTION) ×2 IMPLANT
PAD ARMBOARD 7.5X6 YLW CONV (MISCELLANEOUS) ×4 IMPLANT
PAD ENT ADHESIVE 25PK (MISCELLANEOUS) ×2 IMPLANT
PENCIL BUTTON HOLSTER BLD 10FT (ELECTRODE) IMPLANT
SOLUTION BUTLER CLEAR DIP (MISCELLANEOUS) ×1 IMPLANT
SPECIMEN JAR SMALL (MISCELLANEOUS) ×1 IMPLANT
SPLINT NASAL DOYLE BI-VL (GAUZE/BANDAGES/DRESSINGS) ×1 IMPLANT
SPONGE NEURO XRAY DETECT 1X3 (DISPOSABLE) ×2 IMPLANT
SUT ETHILON 3 0 FSL (SUTURE) ×1 IMPLANT
SUT PLAIN 4 0 ~~LOC~~ 1 (SUTURE) ×1 IMPLANT
SWAB COLLECTION DEVICE MRSA (MISCELLANEOUS) IMPLANT
SYR 5ML LL (SYRINGE) IMPLANT
SYR BULB 3OZ (MISCELLANEOUS) IMPLANT
SYR CONTROL 10ML LL (SYRINGE) IMPLANT
TOWEL OR 17X24 6PK STRL BLUE (TOWEL DISPOSABLE) ×2 IMPLANT
TOWEL OR 17X26 10 PK STRL BLUE (TOWEL DISPOSABLE) ×2 IMPLANT
TRACKER ENT INSTRUMENT (MISCELLANEOUS) ×2 IMPLANT
TRACKER ENT PATIENT (MISCELLANEOUS) ×2 IMPLANT
TRAP SPECIMEN MUCOUS 40CC (MISCELLANEOUS) IMPLANT
TRAY ENT MC OR (CUSTOM PROCEDURE TRAY) ×2 IMPLANT
TUBE ANAEROBIC SPECIMEN COL (MISCELLANEOUS) IMPLANT
TUBE CONNECTING 12X1/4 (SUCTIONS) ×4 IMPLANT
TUBING EXTENTION W/L.L. (IV SETS) ×2 IMPLANT
TUBING STRAIGHTSHOT EPS 5PK (TUBING) ×2 IMPLANT
WATER STERILE IRR 1000ML POUR (IV SOLUTION) ×2 IMPLANT
WIPE INSTRUMENT VISIWIPE 73X73 (MISCELLANEOUS) ×2 IMPLANT

## 2012-02-02 NOTE — Anesthesia Postprocedure Evaluation (Signed)
  Anesthesia Post-op Note  Patient: Natalie Olsen  Procedure(s) Performed: Procedure(s) (LRB) with comments: ENDOSCOPIC SINUS SURGERY WITH FUSION NAVIGATION (Bilateral) - NASAL SEPTOPLASTY WITH BILATERAL INFERIOR TURBUINATE REDUCTION; ENDOSCOPIC RIGHT REPAIR OF CHOANAL ATRESIA FUSION SCAN WITH FUSION   TURBINATE REDUCTION (Bilateral) - BILATERAL INFERIOR TURBINATE REDUCTION  Patient Location: PACU  Anesthesia Type: General  Level of Consciousness: awake  Airway and Oxygen Therapy: Patient Spontanous Breathing  Post-op Pain: mild  Post-op Assessment: Post-op Vital signs reviewed, Patient's Cardiovascular Status Stable, Respiratory Function Stable, Patent Airway, No signs of Nausea or vomiting and Pain level controlled  Post-op Vital Signs: stable  Complications: No apparent anesthesia complications

## 2012-02-02 NOTE — Anesthesia Procedure Notes (Signed)
Procedure Name: Intubation Date/Time: 02/02/2012 10:20 AM Performed by: Gayla Medicus Pre-anesthesia Checklist: Patient identified, Timeout performed, Emergency Drugs available, Suction available and Patient being monitored Patient Re-evaluated:Patient Re-evaluated prior to inductionOxygen Delivery Method: Circle system utilized Preoxygenation: Pre-oxygenation with 100% oxygen Intubation Type: IV induction Ventilation: Mask ventilation without difficulty Laryngoscope Size: Mac and 3 Grade View: Grade I Tube type: Oral Tube size: 7.5 mm Number of attempts: 1 Airway Equipment and Method: Stylet and LTA kit utilized Placement Confirmation: ETT inserted through vocal cords under direct vision,  positive ETCO2 and breath sounds checked- equal and bilateral Secured at: 22 cm Tube secured with: Tape Dental Injury: Teeth and Oropharynx as per pre-operative assessment

## 2012-02-02 NOTE — Anesthesia Preprocedure Evaluation (Signed)
Anesthesia Evaluation  Patient identified by MRN, date of birth, ID band Patient awake    Reviewed: Allergy & Precautions, H&P , NPO status , Patient's Chart, lab work & pertinent test results, reviewed documented beta blocker date and time   Airway Mallampati: II TM Distance: >3 FB Neck ROM: full    Dental   Pulmonary neg pulmonary ROS,  breath sounds clear to auscultation        Cardiovascular hypertension, + Valvular Problems/Murmurs Rhythm:regular     Neuro/Psych PSYCHIATRIC DISORDERS Anxiety negative neurological ROS     GI/Hepatic negative GI ROS, Neg liver ROS,   Endo/Other  negative endocrine ROS  Renal/GU negative Renal ROS  negative genitourinary   Musculoskeletal   Abdominal   Peds  Hematology negative hematology ROS (+)   Anesthesia Other Findings See surgeon's H&P   Reproductive/Obstetrics negative OB ROS                           Anesthesia Physical Anesthesia Plan  ASA: II  Anesthesia Plan: General   Post-op Pain Management:    Induction: Intravenous  Airway Management Planned: Oral ETT  Additional Equipment:   Intra-op Plan:   Post-operative Plan: Extubation in OR  Informed Consent: I have reviewed the patients History and Physical, chart, labs and discussed the procedure including the risks, benefits and alternatives for the proposed anesthesia with the patient or authorized representative who has indicated his/her understanding and acceptance.   Dental Advisory Given  Plan Discussed with: CRNA and Surgeon  Anesthesia Plan Comments:         Anesthesia Quick Evaluation

## 2012-02-02 NOTE — Brief Op Note (Signed)
02/02/2012  12:13 PM  PATIENT:  Natalie Olsen  46 y.o. female  PRE-OPERATIVE DIAGNOSIS:  Nasal Obstruction  POST-OPERATIVE DIAGNOSIS:  Nasal Obstruction  PROCEDURE:  Procedure(s) (LRB) with comments: ENDOSCOPIC SINUS SURGERY WITH FUSION NAVIGATION (Bilateral) - NASAL SEPTOPLASTY WITH BILATERAL INFERIOR TURBUINATE REDUCTION; ENDOSCOPIC RIGHT REPAIR OF CHOANAL ATRESIA FUSION SCAN WITH FUSION   TURBINATE REDUCTION (Bilateral) - BILATERAL INFERIOR TURBINATE REDUCTION  SURGEON:  Surgeon(s) and Role:    * Osborn Coho, MD - Primary  PHYSICIAN ASSISTANT:   ASSISTANTS: none   ANESTHESIA:   general  EBL:  Total I/O In: 1300 [I.V.:1300] Out: - 100  BLOOD ADMINISTERED:none  DRAINS: none   LOCAL MEDICATIONS USED:  LIDOCAINE  and Amount: 7 ml  SPECIMEN:  No Specimen  DISPOSITION OF SPECIMEN:  N/A  COUNTS:  YES  TOURNIQUET:  * No tourniquets in log *  DICTATION: .Other Dictation: Dictation Number [id number]  PLAN OF CARE: Discharge to home after PACU  PATIENT DISPOSITION:  PACU - hemodynamically stable.   Delay start of Pharmacological VTE agent (>24hrs) due to surgical blood loss or risk of bleeding: not applicable

## 2012-02-02 NOTE — Progress Notes (Signed)
Paged MD regarding orders not being signed in Epic. MD states he will sign orders when he arrives.

## 2012-02-02 NOTE — H&P (Signed)
Natalie Olsen is an 46 y.o. female.   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   Past Medical History  Diagnosis Date  . Renal mass 2013  . Heart murmur     PT STATES HER MEDICAL DOCTOR HEARD A MURMUR -BUT WAS TOLD OF NO SIGNICIFANCE-NO FURTHER WORK UP NEEDED  . Anxiety   . Hypertension     off meds since 11/18/2011  . Cancer 10/2011    rt renal cell ca  . History of seasonal allergies     Past Surgical History  Procedure Date  . Cesarean section     x2  . Breast enhancement surgery 2005  . Tubal ligation 1989  . Tummy tuck 2006  . Cystoscopy, rgp 10/15/11    AT [facility]  . Laparoscopic nephrectomy 11/08/2011    Procedure: LAPAROSCOPIC NEPHRECTOMY;  Surgeon: Crecencio Mc, MD;  Location: [facility];  Service: Urology;  Laterality: Right;  RIGHT LAPAROSCOPIC RADICAL NEPHRECTOMY, REPAIR OF SUTURE SINUS    . Breast surgery     augmentation    History reviewed. No pertinent family history. Social History:  reports that she has never smoked. She has never used smokeless tobacco. She reports that she drinks alcohol. She reports that she does not use illicit drugs.  Allergies:  Allergies  Allergen Reactions  . Penicillins Hives    Medications Prior to Admission  Medication Sig Dispense Refill  . cetirizine (ZYRTEC) 10 MG tablet Take 10 mg by mouth daily.      . citalopram (CELEXA) 10 MG tablet Take 10 mg by mouth at bedtime.      Marland Kitchen levofloxacin (LEVAQUIN) 500 MG tablet Take 500 mg by mouth daily.      . fluticasone (FLONASE) 50 MCG/ACT nasal spray Place 1 spray into the nose daily. IN THE AM      . losartan-hydrochlorothiazide (HYZAAR) 50-12.5 MG per tablet Take 1 tablet by mouth at bedtime.         No results found for this or any previous visit (from the past 48 hour(s)). No results found.  Review of Systems  Constitutional: Negative.   Respiratory: Negative.   Cardiovascular: Negative.    Gastrointestinal: Negative.   Neurological: Negative.     Blood pressure 140/100, pulse 69, temperature 97.5 F (36.4 C), temperature source Oral, resp. rate 18, SpO2 99.00%. Physical Exam  Constitutional: She is oriented to person, place, and time. She appears well-developed and well-nourished.  HENT:  Right Ear: Hearing and external ear normal.  Left Ear: Hearing and external ear normal.  Nose: Septal deviation present.  Mouth/Throat: Uvula is midline and oropharynx is clear and moist.  Neck: Normal range of motion. Neck supple.  Cardiovascular: Normal rate and regular rhythm.   Respiratory: Effort normal and breath sounds normal.  GI: Soft.  Musculoskeletal: Normal range of motion.  Neurological: She is alert and oriented to person, place, and time.     Assessment/Plan Adm for op ESS and repair of choanal atresia, septoplasty and inf turb reduction, plan OWER if needed postop.  Sindia Kowalczyk 02/02/2012, 9:55 AM

## 2012-02-02 NOTE — Preoperative (Signed)
Beta Blockers   Reason not to administer Beta Blockers:Not Applicable 

## 2012-02-02 NOTE — Transfer of Care (Signed)
Immediate Anesthesia Transfer of Care Note  Patient: Natalie Olsen  Procedure(s) Performed: Procedure(s) (LRB) with comments: ENDOSCOPIC SINUS SURGERY WITH FUSION NAVIGATION (Bilateral) - NASAL SEPTOPLASTY WITH BILATERAL INFERIOR TURBUINATE REDUCTION; ENDOSCOPIC RIGHT REPAIR OF CHOANAL ATRESIA FUSION SCAN WITH FUSION   TURBINATE REDUCTION (Bilateral) - BILATERAL INFERIOR TURBINATE REDUCTION  Patient Location: PACU  Anesthesia Type: General  Level of Consciousness: awake, alert  and oriented  Airway & Oxygen Therapy: Patient Spontanous Breathing and Patient connected to face mask oxygen  Post-op Assessment: Report given to PACU RN, Post -op Vital signs reviewed and stable, Patient moving all extremities and Patient able to stick tongue midline  Post vital signs: Reviewed and stable  Complications: No apparent anesthesia complications

## 2012-02-03 ENCOUNTER — Encounter (HOSPITAL_COMMUNITY): Payer: Self-pay | Admitting: Otolaryngology

## 2012-02-03 NOTE — Op Note (Signed)
NAME:  Natalie Olsen, Natalie Olsen NO.:  [id number]  MEDICAL RECORD NO.:  [id number]  LOCATION:  [facility]                         FACILITY:  [facility]  PHYSICIAN:  Kinnie Scales. Annalee Genta, M.D.DATE OF BIRTH:  03/05/1966  DATE OF PROCEDURE:  02/02/2012 DATE OF DISCHARGE:  02/02/2012                              OPERATIVE REPORT   PREOPERATIVE DIAGNOSES: 1. Congenital right choanal atresia. 2. Deviated nasal septum. 3. Bilateral inferior turbinate hypertrophy. 4. Chronic nasal airway obstruction.  POSTOPERATIVE DIAGNOSES: 1. Congenital right choanal atresia. 2. Deviated nasal septum. 3. Bilateral inferior turbinate hypertrophy. 4. Chronic nasal airway obstruction.  INDICATION FOR SURGERY: 1. Congenital right choanal atresia. 2. Deviated nasal septum. 3. Bilateral inferior turbinate hypertrophy. 4. Chronic nasal airway obstruction.  SURGICAL PROCEDURES: 1. Nasal septoplasty. 2. Bilateral inferior turbinate reduction. 3. Endoscopic repair of right choanal atresia with intraoperative     computer-assisted navigation (Fusion).  ANESTHESIA:  General endotracheal.  SURGEON:  Kinnie Scales. Annalee Genta, MD  ESTIMATED BLOOD LOSS:  Approximately 100 mL.  COMPLICATIONS:  No complications.  The patient was transferred from the operating room to the recovery room in stable condition.  BRIEF HISTORY:  The patient is a 46 year old white female, referred to our office for evaluation of progressive symptoms of nasal airway obstruction.  She has a history of chronic right nasal discharge with associated progressive nasal congestion.  Examination in the office showed severely deviated septum and bilateral turbinate hypertrophy with thick mucopurulent material in the right nostril.  A CT scan of the sinuses was performed and the patient was found to have deviated septum, turbinate hypertrophy, and a right choanal atresia with both bony and membranous obstruction of the posterior nasopharynx on the  right-hand side.  There was normal-appearing sinonasal anatomy and development without evidence of active sinusitis.  Given the patient's history, examination, and findings, I recommended the above surgical procedures. The risks and benefits were discussed in detail and the patient understood and concurred with our plan for surgery, which is scheduled as an outpatient under general anesthesia on February 02, 2012.  Prior to surgery, a complete axial CT scan of the sinus was performed in Fusion format for intraoperative computer-assisted navigation.  The surgical procedure was scheduled on elective basis at [facility] on February 02, 2012.  PROCEDURE:  The patient was brought to the operating room and the patient was placed in supine position on the operating table.  General endotracheal anesthesia was established without difficulty.  The patient was adequately anesthetized, she was positioned on the operating table, and prepped and draped in a sterile fashion.  She was injected with a total of 7 mL of 1% lidocaine with 1:100,000 solution epinephrine, injected in the submucosal fashion along the nasal septum and inferior turbinates bilaterally.  She was also injected in the sphenopalatine fossa via a transpalatal injection, total injection was 7 mL.  The patient's nose was then packed with Afrin-soaked cottonoid pledgets and left in place for approximately 10 minutes to allow for vasoconstriction and hemostasis.  The Fusion navigation headgear was applied, and anatomic and surgical landmarks were identified and confirmed.  The Fusion navigation tool was used throughout the repair of the  choanal atresia.  With the patient prepared for surgery, bilateral nasal endoscopy was performed.  Left hand side showed a deviated nasal septum with inferior turbinate hypertrophy.  No discharge or infection on the right-hand side.  The patient had a complete stenosis of the posterior right  nasal passageway into the nasopharynx.  No opening.  Gentle palpation showed both bony and membranous obstruction.  The patient's right nasal cavity was filled with mucoid material, which was aspirated.  Procedure was begun with nasal septoplasty.  Left hemitransfixion incision was created, and a mucoperichondrial flap was elevated from anterior to posterior along the left-hand side.  Deviated bone and cartilage were mobilized.  The cartilaginous septum was crossed anteriorly and a mucoperichondrial flap was elevated on the right.  Midseptal cartilage was removed, this was later morselized and returned to the mucoperichondrial pocket.  Dissection was then carried from anterior to posterior and superior bony deviation was mobilized with a 4-mm osteotome and removed with through-cutting forceps.  With the septum in the midline position, the nasal passageway was reinspected.  The posterior aspects of the nasal septum and bony septal attachment to the posterior palatal bone was then mobilized with 4-mm osteotome.  This allowed posterior access from the right side into the nasopharynx.  Using a 0-degree endoscope, nasal cavity was examined bilaterally using Fusion navigation to localize the appropriate entry point into the nasopharynx.  Soft tissue was carefully dissected and bony atresia was resected along its attachment to the nasal septum.  Using a large back- cutting bone rongeur, the posterior-inferior aspect of the bony nasal septum was resected and soft tissue was taken down using the microdebrider, creating a widely patent ostium from right nasal passageway into the nasopharynx.  There was some adenoidal hypertrophy, which was resected and monopolar suction cautery was used along the free mucosal margins for hemostasis as well as within the adenoidal tissue. The posterior nasal choanae were widely patent bilaterally.  The septum was reconstructed with replacement of the morselized  septal cartilage and reapproximation of the mucosal flaps with 4-0 gut suture on a Keith needle in a horizontal mattressing fashion.  Bilateral Doyle nasal septal splints were then placed after the application of Bactroban ointment, and sutured in position with a 3-0 Ethilon suture.  Inferior turbinate reduction was performed with cautery set at 12 watts. Two submucosal passes were made in each inferior turbinate where the turbinate had been adequately cauterized.  Anterior incisions were created, overlying soft tissue elevated and small amount of turbinate bone resected.  The turbinates were then outfractured creating more patent nasal cavity.  Along the left posterior inferior turbinate, there was hypertrophied mucosa and this was cauterized with monopolar suction cautery, creating a widely patent posterior nasal cavity.  The patient's nasal cavity was irrigated and suctioned.  Surgical debris was cleared.  Orogastric tube was passed.  Stomach contents were aspirated.  The patient was then awakened from her anesthetic, extubated, and transferred from the operating room to the recovery room in stable condition.  No complications.  Blood loss approximately 100 mL.          ______________________________ Kinnie Scales. Annalee Genta, M.D.     DLS/MEDQ  D:  21/[id number]  T:  02/03/2012  Job:  [id number]

## 2012-05-30 ENCOUNTER — Ambulatory Visit (HOSPITAL_COMMUNITY)
Admission: RE | Admit: 2012-05-30 | Discharge: 2012-05-30 | Disposition: A | Discharge status: Home / Self Care | Payer: BC Managed Care – PPO | Source: Ambulatory Visit | Attending: Urology | Admitting: Urology

## 2012-05-30 ENCOUNTER — Other Ambulatory Visit (HOSPITAL_COMMUNITY): Payer: Self-pay | Admitting: Urology

## 2012-05-30 DIAGNOSIS — C649 Malignant neoplasm of unspecified kidney, except renal pelvis: Secondary | ICD-10-CM

## 2012-05-30 DIAGNOSIS — I10 Essential (primary) hypertension: Secondary | ICD-10-CM | POA: Insufficient documentation

## 2012-05-30 DIAGNOSIS — Z905 Acquired absence of kidney: Secondary | ICD-10-CM | POA: Insufficient documentation

## 2012-12-05 ENCOUNTER — Ambulatory Visit (HOSPITAL_COMMUNITY)
Admission: RE | Admit: 2012-12-05 | Discharge: 2012-12-05 | Disposition: A | Discharge status: Home / Self Care | Payer: BC Managed Care – PPO | Source: Ambulatory Visit | Attending: Urology | Admitting: Urology

## 2012-12-05 ENCOUNTER — Other Ambulatory Visit (HOSPITAL_COMMUNITY): Payer: Self-pay | Admitting: Urology

## 2012-12-05 DIAGNOSIS — C649 Malignant neoplasm of unspecified kidney, except renal pelvis: Secondary | ICD-10-CM

## 2012-12-05 DIAGNOSIS — I10 Essential (primary) hypertension: Secondary | ICD-10-CM | POA: Insufficient documentation

## 2013-05-06 IMAGING — CR DG CHEST 2V
2 series · 2 of 2 positions shown · non-contrast
Comparison: None.

CLINICAL DATA: Renal cell carcinoma

CHEST - 2 VIEW

[w chest pa]
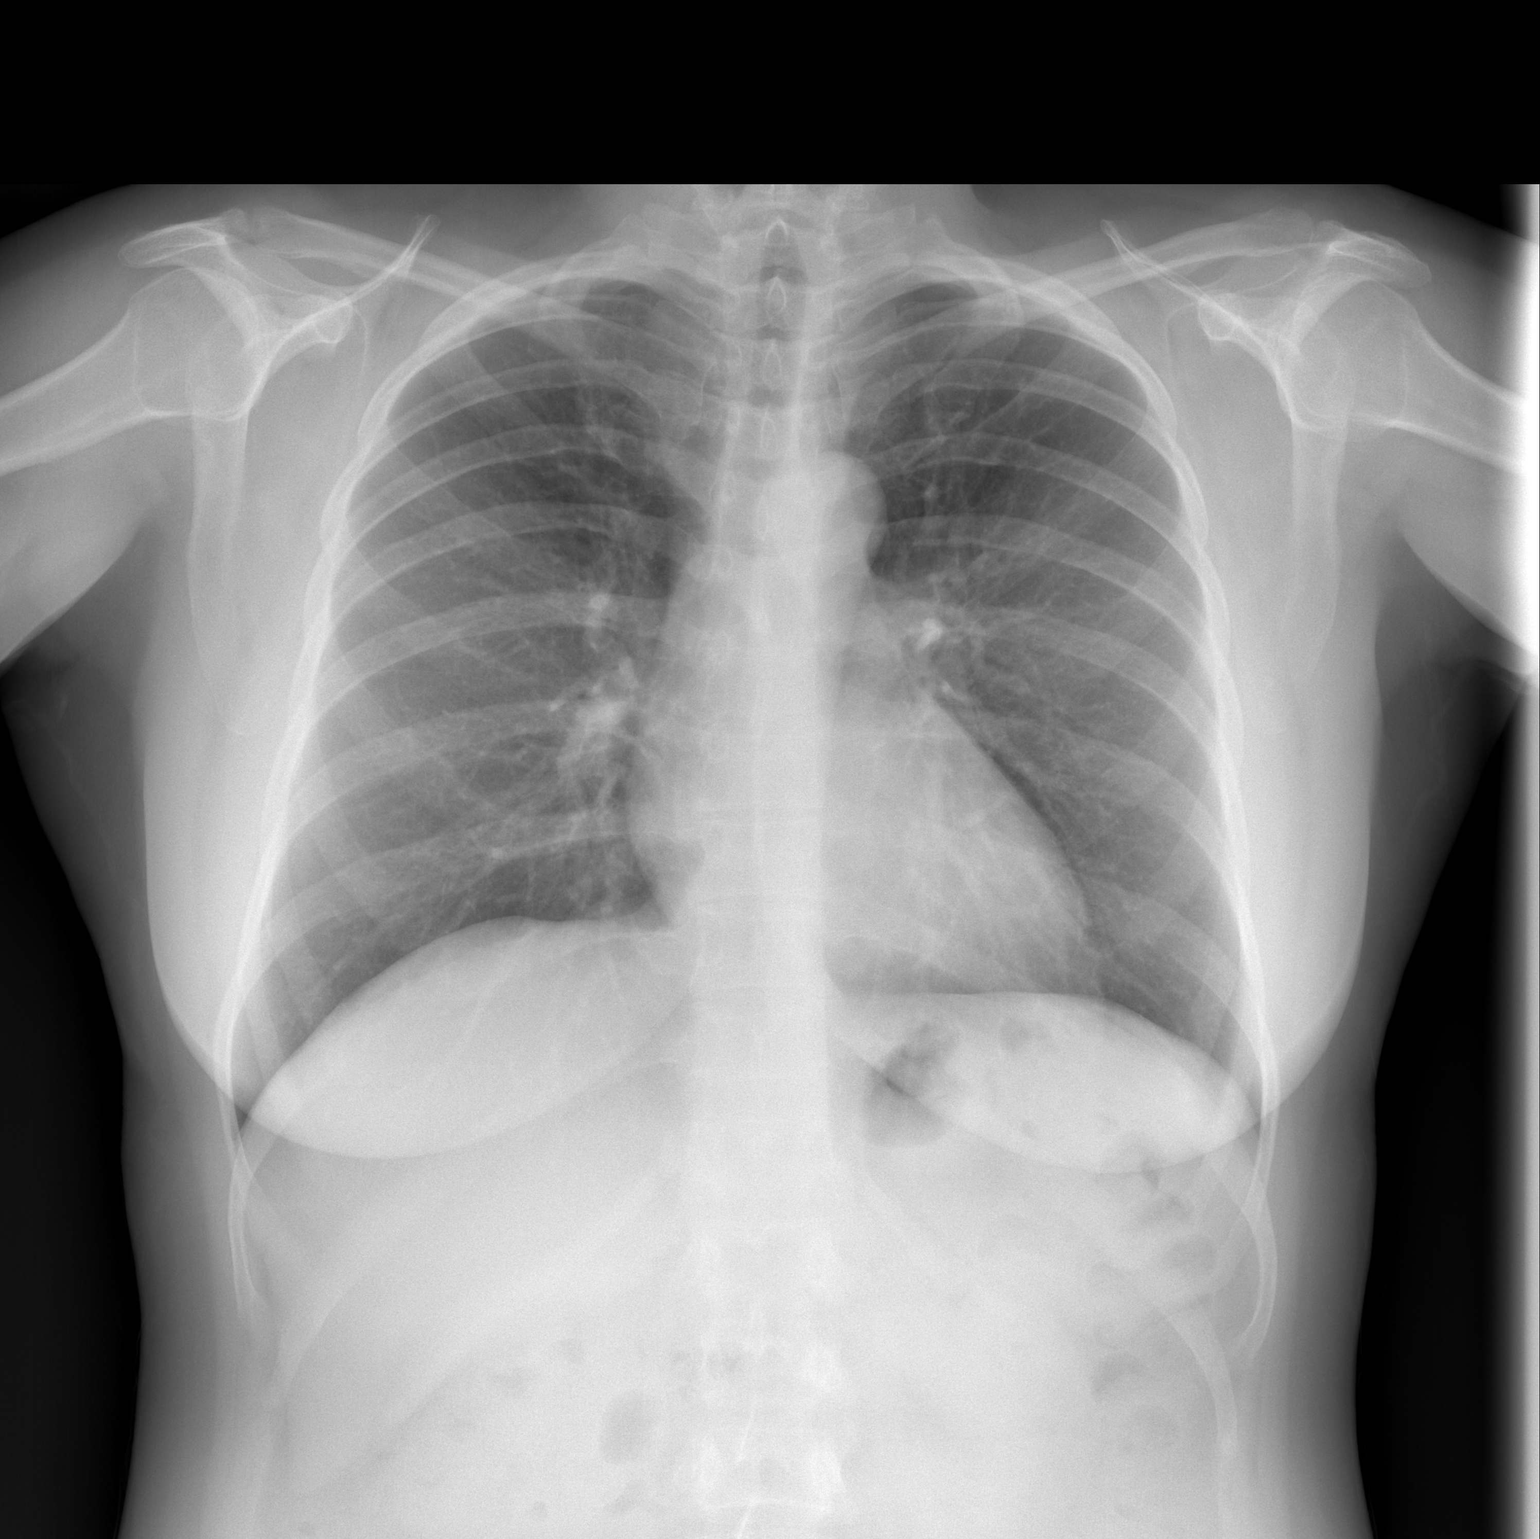

[w chest lat]
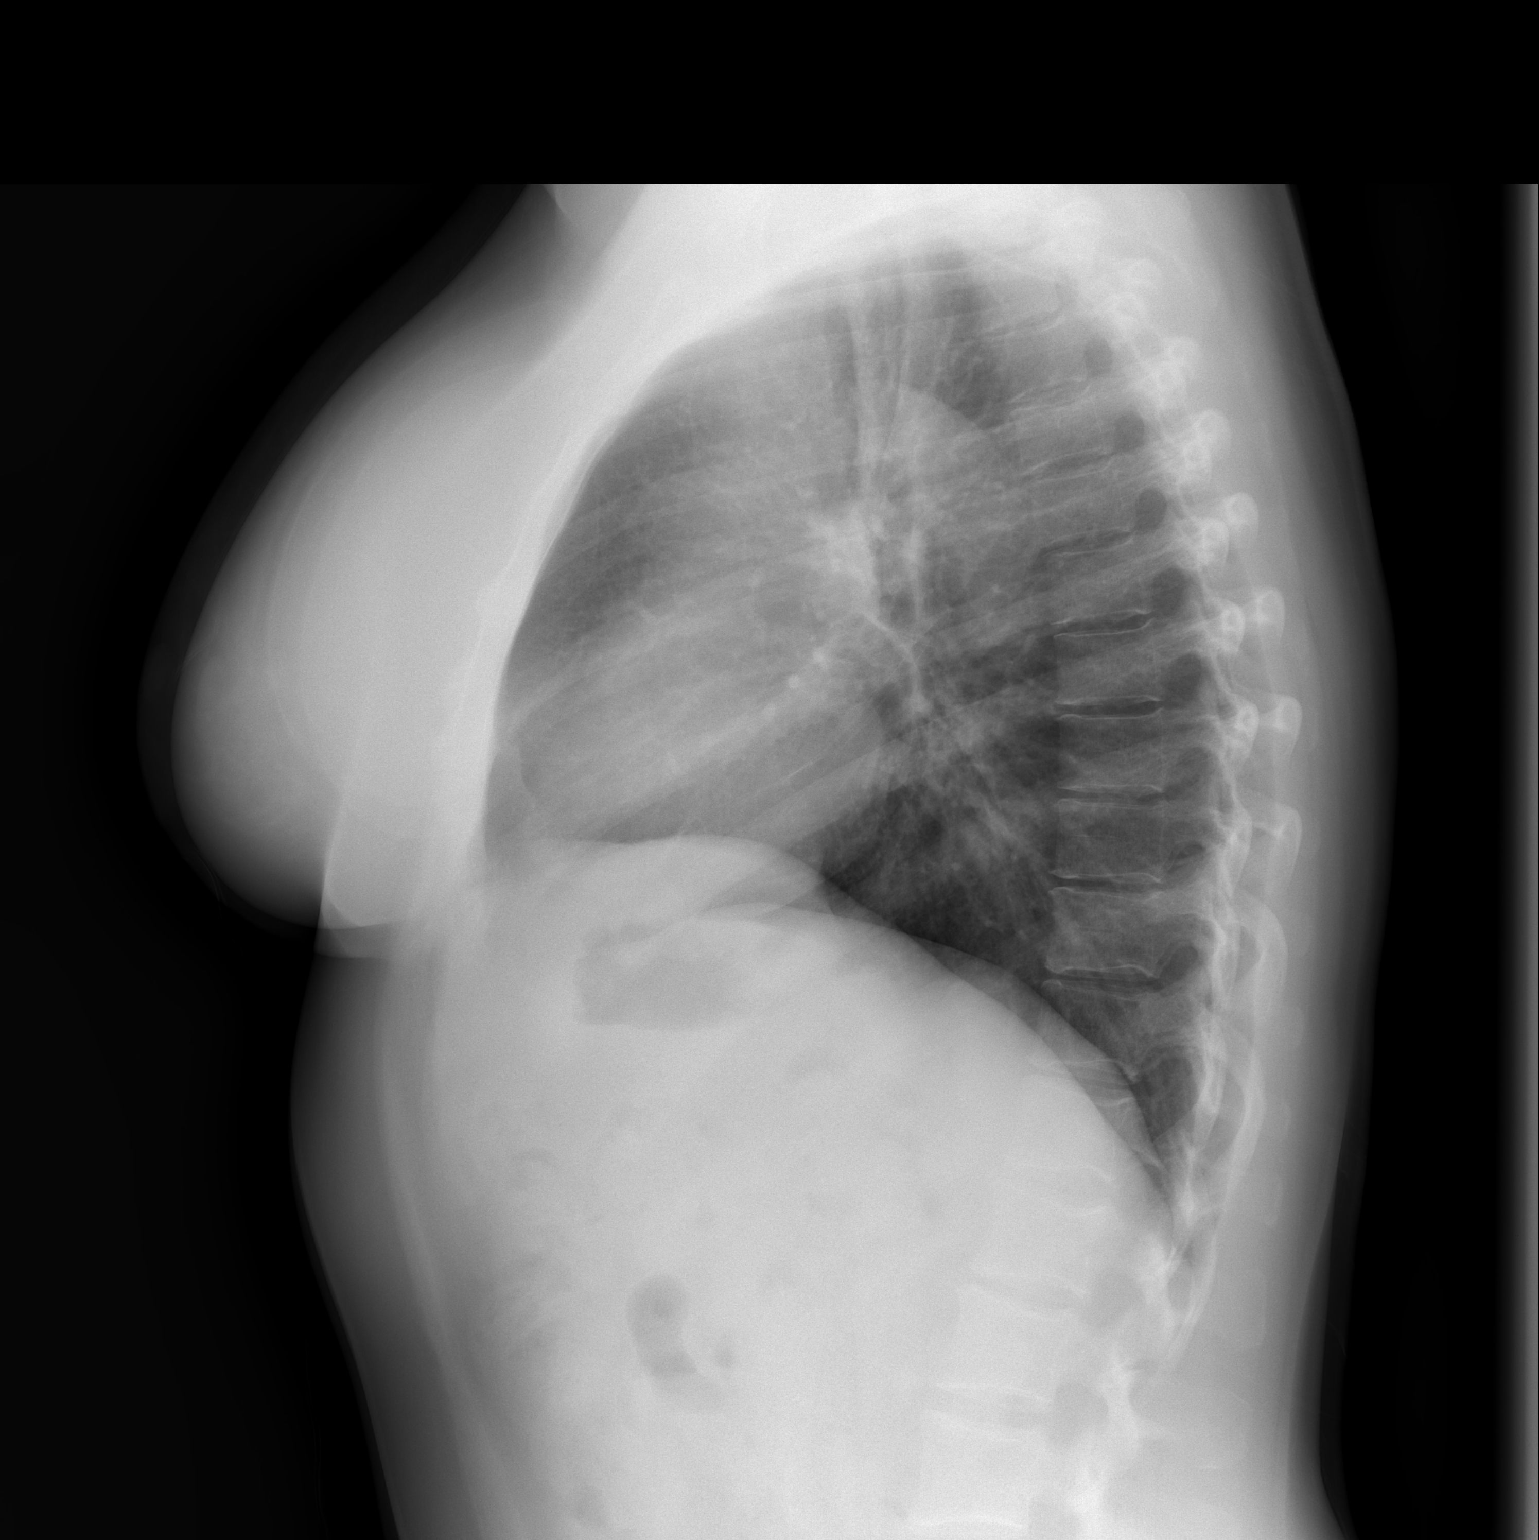

[2 of 2 positions shown; findings below may reference images not displayed]

FINDINGS: Cardiomediastinal silhouette is unremarkable.  No acute
infiltrate or pleural effusion.  No pulmonary edema.  The bony
thorax is unremarkable.
IMPRESSION: No active disease.

## 2013-06-27 ENCOUNTER — Other Ambulatory Visit (HOSPITAL_COMMUNITY): Payer: Self-pay | Admitting: Urology

## 2013-06-27 ENCOUNTER — Ambulatory Visit (HOSPITAL_COMMUNITY)
Admission: RE | Admit: 2013-06-27 | Discharge: 2013-06-27 | Disposition: A | Discharge status: Home / Self Care | Payer: BC Managed Care – PPO | Source: Ambulatory Visit | Attending: Urology | Admitting: Urology

## 2013-06-27 DIAGNOSIS — C649 Malignant neoplasm of unspecified kidney, except renal pelvis: Secondary | ICD-10-CM

## 2013-12-27 ENCOUNTER — Ambulatory Visit (HOSPITAL_COMMUNITY)
Admission: RE | Admit: 2013-12-27 | Discharge: 2013-12-27 | Disposition: A | Discharge status: Home / Self Care | Payer: BC Managed Care – PPO | Source: Ambulatory Visit | Attending: Urology | Admitting: Urology

## 2013-12-27 ENCOUNTER — Other Ambulatory Visit (HOSPITAL_COMMUNITY): Payer: Self-pay | Admitting: Urology

## 2013-12-27 DIAGNOSIS — Z905 Acquired absence of kidney: Secondary | ICD-10-CM | POA: Insufficient documentation

## 2013-12-27 DIAGNOSIS — C649 Malignant neoplasm of unspecified kidney, except renal pelvis: Secondary | ICD-10-CM | POA: Diagnosis present

## 2015-01-07 ENCOUNTER — Other Ambulatory Visit (HOSPITAL_COMMUNITY): Payer: Self-pay | Admitting: Urology

## 2015-01-07 ENCOUNTER — Ambulatory Visit (HOSPITAL_COMMUNITY)
Admission: RE | Admit: 2015-01-07 | Discharge: 2015-01-07 | Disposition: A | Discharge status: Home / Self Care | Payer: BLUE CROSS/BLUE SHIELD | Source: Ambulatory Visit | Attending: Urology | Admitting: Urology

## 2015-01-07 DIAGNOSIS — C649 Malignant neoplasm of unspecified kidney, except renal pelvis: Secondary | ICD-10-CM | POA: Diagnosis not present

## 2016-01-15 ENCOUNTER — Ambulatory Visit (HOSPITAL_COMMUNITY)
Admission: RE | Admit: 2016-01-15 | Discharge: 2016-01-15 | Disposition: A | Discharge status: Home / Self Care | Payer: BLUE CROSS/BLUE SHIELD | Source: Ambulatory Visit | Attending: Urology | Admitting: Urology

## 2016-01-15 ENCOUNTER — Other Ambulatory Visit (HOSPITAL_COMMUNITY): Payer: Self-pay | Admitting: Urology

## 2016-01-15 DIAGNOSIS — C649 Malignant neoplasm of unspecified kidney, except renal pelvis: Secondary | ICD-10-CM | POA: Diagnosis present

## 2016-01-15 DIAGNOSIS — R918 Other nonspecific abnormal finding of lung field: Secondary | ICD-10-CM | POA: Diagnosis not present

## 2017-01-26 ENCOUNTER — Ambulatory Visit (HOSPITAL_COMMUNITY)
Admission: RE | Admit: 2017-01-26 | Discharge: 2017-01-26 | Disposition: A | Discharge status: Home / Self Care | Payer: BLUE CROSS/BLUE SHIELD | Source: Ambulatory Visit | Attending: Urology | Admitting: Urology

## 2017-01-26 ENCOUNTER — Other Ambulatory Visit (HOSPITAL_COMMUNITY): Payer: Self-pay | Admitting: Urology

## 2017-01-26 DIAGNOSIS — Z905 Acquired absence of kidney: Secondary | ICD-10-CM | POA: Diagnosis not present

## 2017-01-26 DIAGNOSIS — Z85528 Personal history of other malignant neoplasm of kidney: Secondary | ICD-10-CM | POA: Diagnosis not present

## 2017-01-26 DIAGNOSIS — Z9882 Breast implant status: Secondary | ICD-10-CM | POA: Diagnosis not present

## 2017-08-09 IMAGING — CR DG CHEST 2V
2 series · 2 of 2 positions shown · non-contrast
Comparison: No prior.

CLINICAL DATA: Cough and wheeze.

EXAM:
CHEST  2 VIEW

[w chest pa]
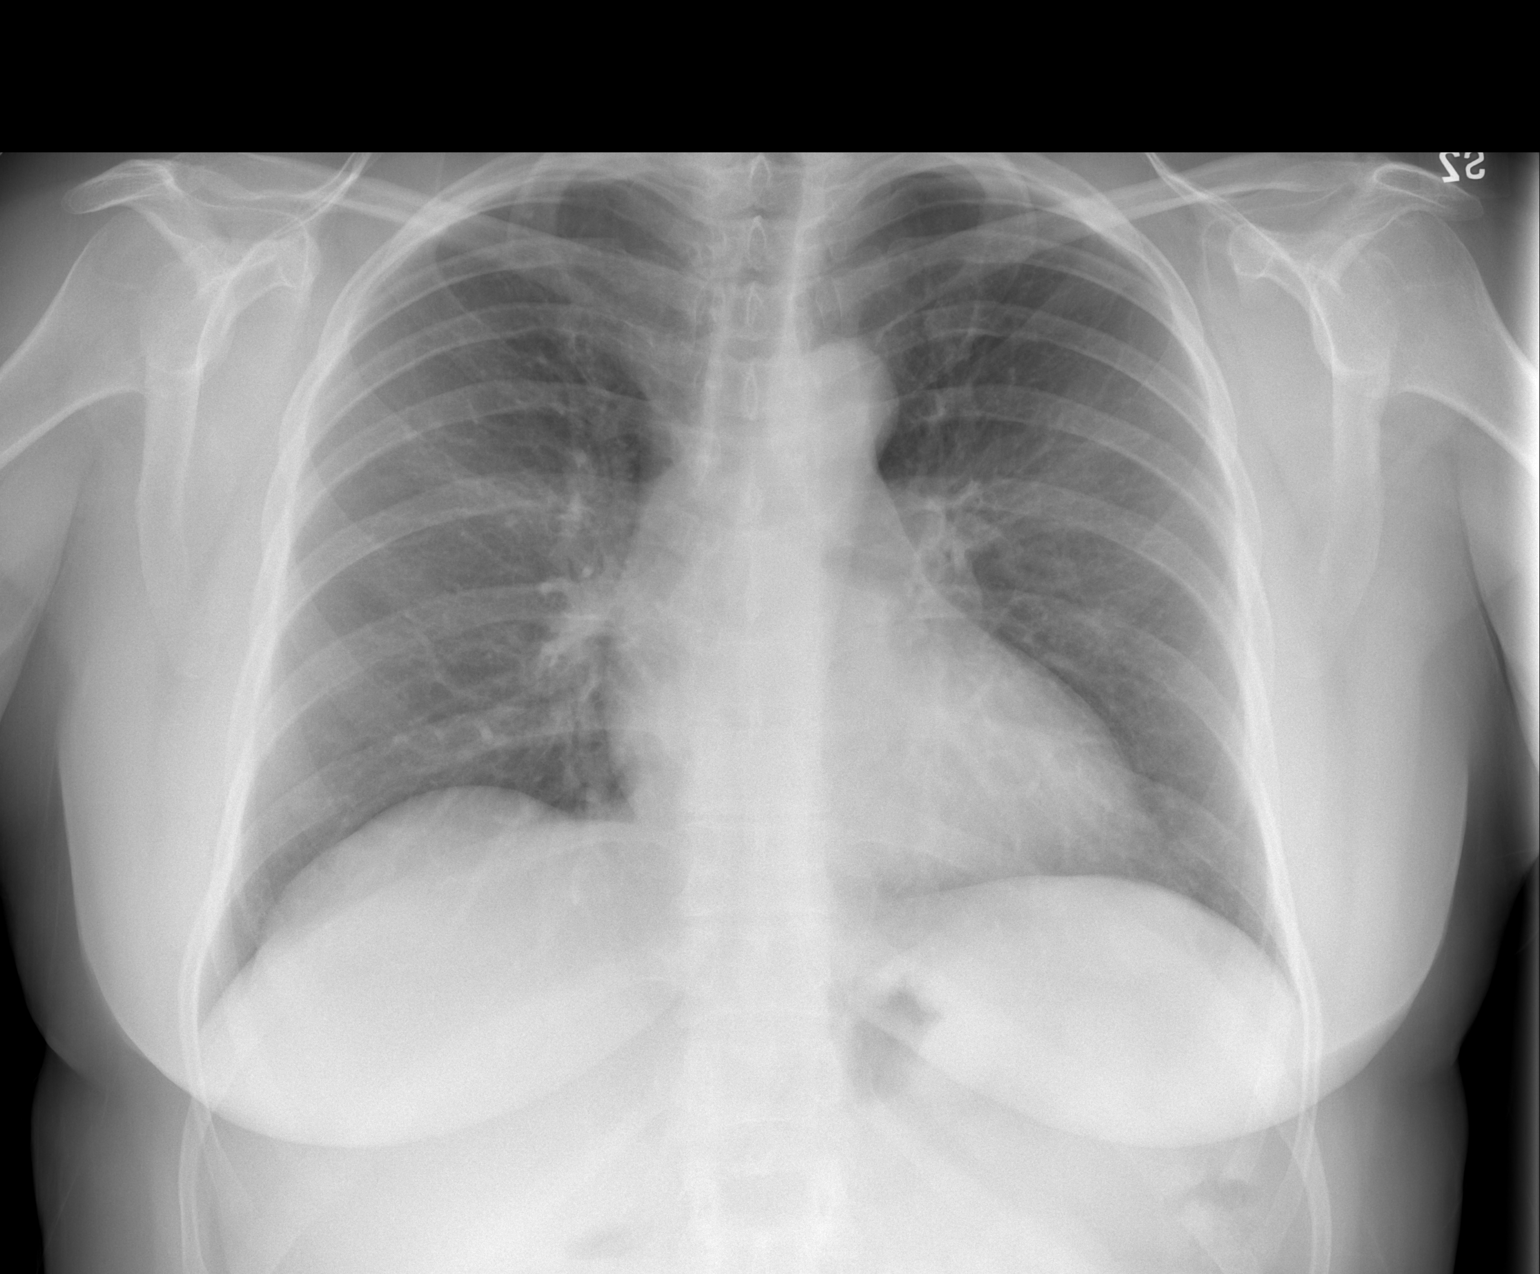

[w chest lat]
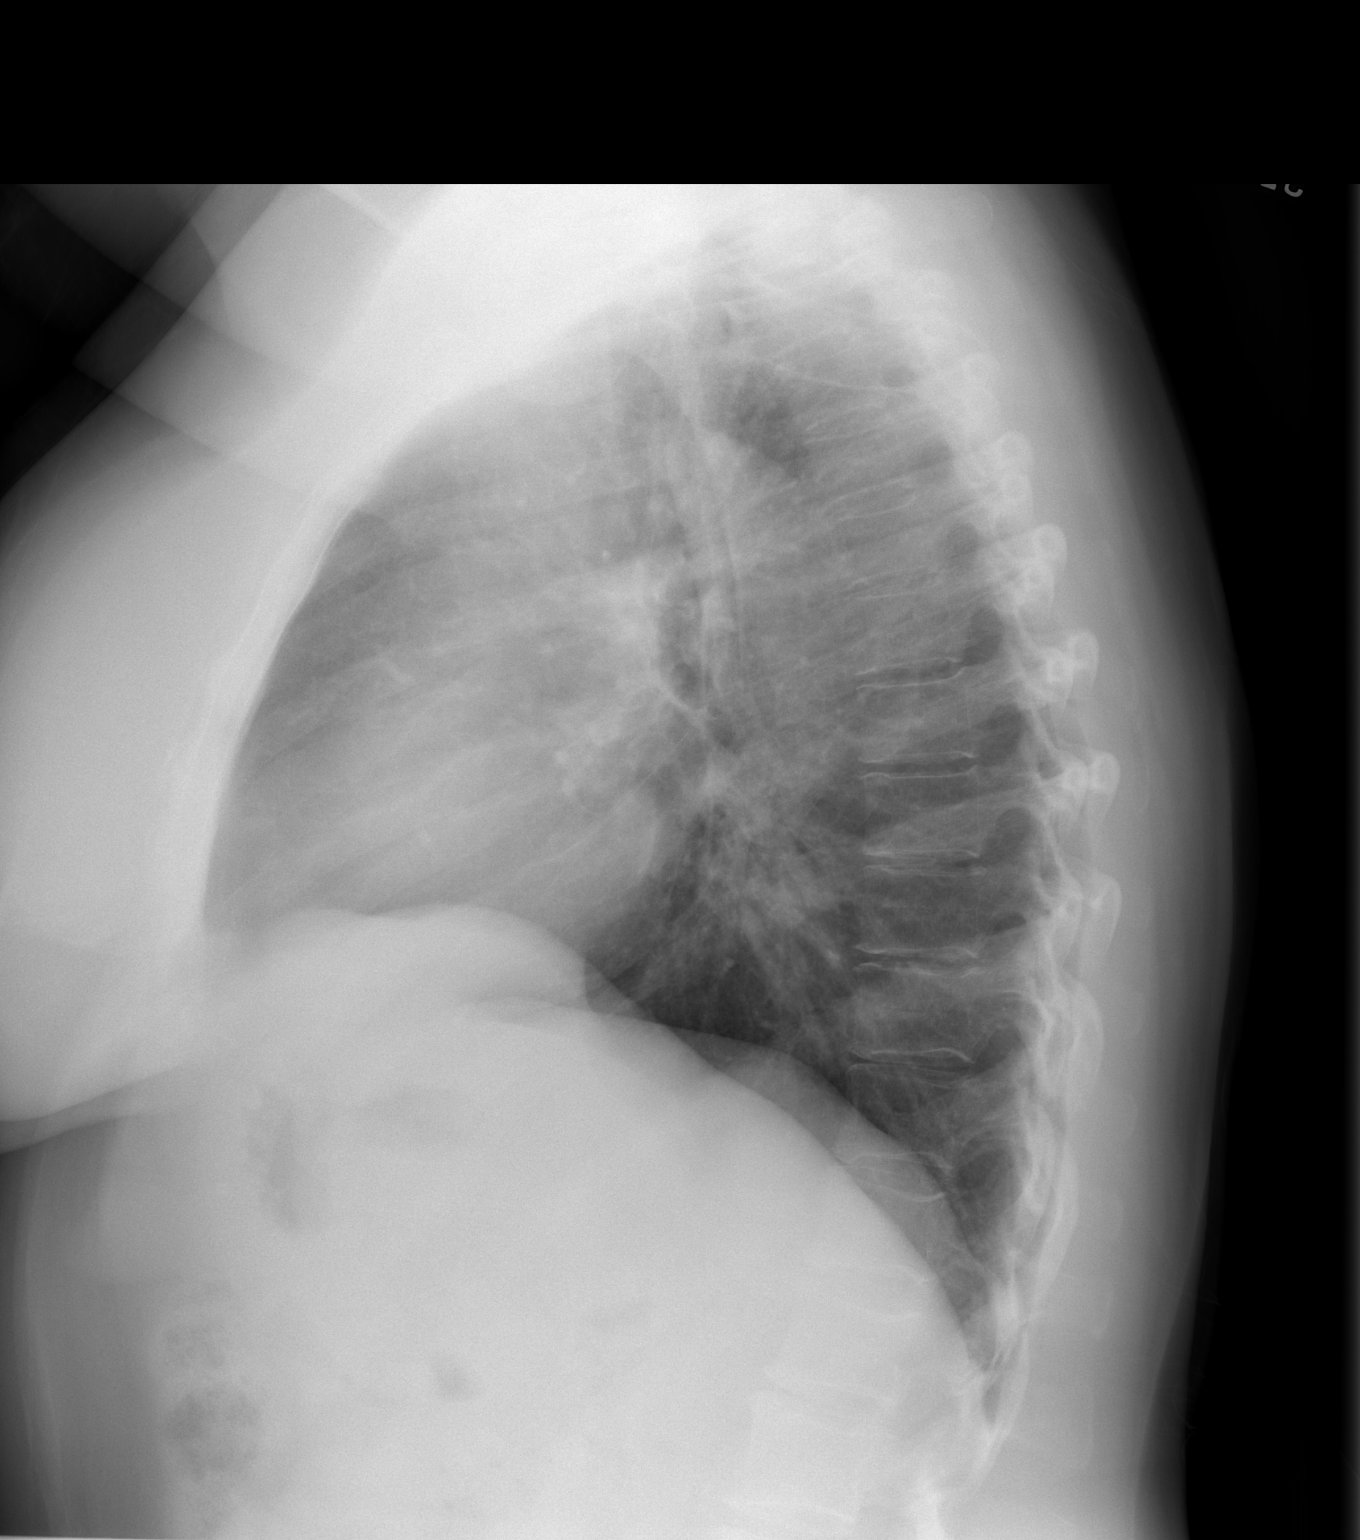

[2 of 2 positions shown; findings below may reference images not displayed]

FINDINGS: Mediastinum and hilar structures normal. Mild bilateral upper lobe
infiltrates cannot be completely excluded. Heart size normal. No
pleural effusion or pneumothorax.
IMPRESSION: Mild bilateral upper lobe infiltrates cannot be completely excluded.
